# Patient Record
Sex: Male | Born: 1945
Health system: Southern US, Community
[De-identification: ages and names within clinical notes are randomized; demographics above are authoritative.]

## PROBLEM LIST (undated history)

## (undated) DIAGNOSIS — E785 Hyperlipidemia, unspecified: Secondary | ICD-10-CM

## (undated) DIAGNOSIS — K5792 Diverticulitis of intestine, part unspecified, without perforation or abscess without bleeding: Secondary | ICD-10-CM

## (undated) DIAGNOSIS — I10 Essential (primary) hypertension: Secondary | ICD-10-CM

## (undated) HISTORY — PX: KNEE ARTHROSCOPY AND ARTHROTOMY: SUR84

## (undated) HISTORY — PX: OTHER SURGICAL HISTORY: SHX169

## (undated) HISTORY — PX: CARPAL TUNNEL RELEASE: SHX101

## (undated) HISTORY — PX: TONSILLECTOMY: SUR1361

---

## 2011-12-27 ENCOUNTER — Emergency Department (INDEPENDENT_AMBULATORY_CARE_PROVIDER_SITE_OTHER): Payer: 59

## 2011-12-27 ENCOUNTER — Emergency Department (HOSPITAL_BASED_OUTPATIENT_CLINIC_OR_DEPARTMENT_OTHER)
Admission: EM | Admit: 2011-12-27 | Discharge: 2011-12-27 | Disposition: A | Discharge status: Home / Self Care | Payer: 59 | Attending: Emergency Medicine | Admitting: Emergency Medicine

## 2011-12-27 ENCOUNTER — Encounter (HOSPITAL_BASED_OUTPATIENT_CLINIC_OR_DEPARTMENT_OTHER): Payer: Self-pay | Admitting: *Deleted

## 2011-12-27 DIAGNOSIS — R112 Nausea with vomiting, unspecified: Secondary | ICD-10-CM | POA: Insufficient documentation

## 2011-12-27 DIAGNOSIS — E785 Hyperlipidemia, unspecified: Secondary | ICD-10-CM | POA: Insufficient documentation

## 2011-12-27 DIAGNOSIS — Z79899 Other long term (current) drug therapy: Secondary | ICD-10-CM | POA: Insufficient documentation

## 2011-12-27 DIAGNOSIS — K921 Melena: Secondary | ICD-10-CM

## 2011-12-27 DIAGNOSIS — R109 Unspecified abdominal pain: Secondary | ICD-10-CM | POA: Insufficient documentation

## 2011-12-27 DIAGNOSIS — A09 Infectious gastroenteritis and colitis, unspecified: Secondary | ICD-10-CM | POA: Insufficient documentation

## 2011-12-27 DIAGNOSIS — Z7982 Long term (current) use of aspirin: Secondary | ICD-10-CM | POA: Insufficient documentation

## 2011-12-27 DIAGNOSIS — N400 Enlarged prostate without lower urinary tract symptoms: Secondary | ICD-10-CM

## 2011-12-27 DIAGNOSIS — R197 Diarrhea, unspecified: Secondary | ICD-10-CM | POA: Insufficient documentation

## 2011-12-27 DIAGNOSIS — R933 Abnormal findings on diagnostic imaging of other parts of digestive tract: Secondary | ICD-10-CM

## 2011-12-27 DIAGNOSIS — I10 Essential (primary) hypertension: Secondary | ICD-10-CM | POA: Insufficient documentation

## 2011-12-27 HISTORY — DX: Essential (primary) hypertension: I10

## 2011-12-27 HISTORY — DX: Diverticulitis of intestine, part unspecified, without perforation or abscess without bleeding: K57.92

## 2011-12-27 HISTORY — DX: Hyperlipidemia, unspecified: E78.5

## 2011-12-27 LAB — COMPREHENSIVE METABOLIC PANEL
ALT: 24 U/L (ref 0–53)
AST: 26 U/L (ref 0–37)
Albumin: 4.5 g/dL (ref 3.5–5.2)
Alkaline Phosphatase: 55 U/L (ref 39–117)
BUN: 18 mg/dL (ref 6–23)
CO2: 26 mEq/L (ref 19–32)
Calcium: 9.8 mg/dL (ref 8.4–10.5)
Chloride: 100 mEq/L (ref 96–112)
Creatinine, Ser: 0.9 mg/dL (ref 0.50–1.35)
GFR calc Af Amer: >90 mL/min (ref >90)
GFR calc non Af Amer: 87 mL/min — ABNORMAL LOW (ref >90)
Glucose, Bld: 115 mg/dL — ABNORMAL HIGH (ref 70–99)
Potassium: 4.1 meq/L (ref 3.5–5.1)
Sodium: 137 meq/L (ref 135–145)
Total Bilirubin: 0.7 mg/dL (ref 0.3–1.2)
Total Protein: 8 g/dL (ref 6.0–8.3)

## 2011-12-27 LAB — CBC
HCT: 44.2 % (ref 39.0–52.0)
HCT: 41.7 % (ref 39.0–52.0)
Hemoglobin: 15.1 g/dL (ref 13.0–17.0)
Hemoglobin: 14.2 g/dL (ref 13.0–17.0)
MCH: 30.7 pg (ref 26.0–34.0)
MCH: 30.6 pg (ref 26.0–34.0)
MCHC: 34.2 g/dL (ref 30.0–36.0)
MCHC: 34.1 g/dL (ref 30.0–36.0)
MCV: 89.8 fL (ref 78.0–100.0)
MCV: 89.9 fL (ref 78.0–100.0)
Platelets: 181 10*3/uL (ref 150–400)
Platelets: 174 10*3/uL (ref 150–400)
RBC: 4.92 MIL/uL (ref 4.22–5.81)
RBC: 4.64 MIL/uL (ref 4.22–5.81)
RDW: 13.2 % (ref 11.5–15.5)
RDW: 13.2 % (ref 11.5–15.5)
WBC: 13.7 10*3/uL — ABNORMAL HIGH (ref 4.0–10.5)
WBC: 12.8 10*3/uL — ABNORMAL HIGH (ref 4.0–10.5)

## 2011-12-27 LAB — URINALYSIS, ROUTINE W REFLEX MICROSCOPIC
Glucose, UA: NEGATIVE mg/dL
Hgb urine dipstick: NEGATIVE
Ketones, ur: 15 mg/dL — AB
Leukocytes, UA: NEGATIVE
Nitrite: NEGATIVE
Protein, ur: NEGATIVE mg/dL
Specific Gravity, Urine: 1.021 (ref 1.005–1.030)
Urobilinogen, UA: 1 mg/dL (ref 0.0–1.0)
pH: 5 (ref 5.0–8.0)

## 2011-12-27 LAB — DIFFERENTIAL
Basophils Absolute: 0 10*3/uL (ref 0.0–0.1)
Basophils Relative: 0 % (ref 0–1)
Eosinophils Absolute: 0.1 10*3/uL (ref 0.0–0.7)
Eosinophils Relative: 0 % (ref 0–5)
Lymphocytes Relative: 6 % — ABNORMAL LOW (ref 12–46)
Lymphs Abs: 0.8 10*3/uL (ref 0.7–4.0)
Monocytes Absolute: 1 10*3/uL (ref 0.1–1.0)
Monocytes Relative: 7 % (ref 3–12)
Neutro Abs: 11.8 10*3/uL — ABNORMAL HIGH (ref 1.7–7.7)
Neutrophils Relative %: 86 % — ABNORMAL HIGH (ref 43–77)

## 2011-12-27 LAB — OCCULT BLOOD X 1 CARD TO LAB, STOOL: Fecal Occult Bld: POSITIVE

## 2011-12-27 LAB — LIPASE, BLOOD: Lipase: 46 U/L (ref 11–59)

## 2011-12-27 LAB — LACTIC ACID, PLASMA: Lactic Acid, Venous: 1.3 mmol/L (ref 0.5–2.2)

## 2011-12-27 MED ORDER — METRONIDAZOLE IN NACL 5-0.79 MG/ML-% IV SOLN
500.0000 mg | Freq: Once | INTRAVENOUS | Status: AC
  Administered 2011-12-27: 500 mg via INTRAVENOUS
  Filled 2011-12-27 (×2): qty 100

## 2011-12-27 MED ORDER — HYDROCODONE-ACETAMINOPHEN 5-325 MG PO TABS: 1.0000 | ORAL_TABLET | Freq: Four times a day (QID) | ORAL | Status: AC | PRN

## 2011-12-27 MED ORDER — ONDANSETRON 8 MG PO TBDP: 8.0000 mg | ORAL_TABLET | Freq: Three times a day (TID) | ORAL | Status: AC | PRN

## 2011-12-27 MED ORDER — HYDROMORPHONE HCL PF 1 MG/ML IJ SOLN
1.0000 mg | Freq: Once | INTRAMUSCULAR | Status: AC
  Administered 2011-12-27: 1 mg via INTRAVENOUS
  Filled 2011-12-27: qty 1

## 2011-12-27 MED ORDER — METRONIDAZOLE 500 MG PO TABS: 500.0000 mg | ORAL_TABLET | Freq: Two times a day (BID) | ORAL | Status: AC

## 2011-12-27 MED ORDER — HYDROMORPHONE HCL PF 1 MG/ML IJ SOLN
0.5000 mg | Freq: Once | INTRAMUSCULAR | Status: AC
  Administered 2011-12-27: 0.5 mg via INTRAVENOUS

## 2011-12-27 MED ORDER — HYDROMORPHONE HCL PF 1 MG/ML IJ SOLN
INTRAMUSCULAR | Status: AC
  Filled 2011-12-27: qty 1

## 2011-12-27 MED ORDER — ONDANSETRON HCL 4 MG/2ML IJ SOLN
4.0000 mg | Freq: Once | INTRAMUSCULAR | Status: AC
  Administered 2011-12-27: 4 mg via INTRAVENOUS
  Filled 2011-12-27: qty 2

## 2011-12-27 MED ORDER — CIPROFLOXACIN HCL 500 MG PO TABS: 500.0000 mg | ORAL_TABLET | Freq: Two times a day (BID) | ORAL | Status: AC

## 2011-12-27 MED ORDER — IOHEXOL 300 MG/ML  SOLN
100.0000 mL | Freq: Once | INTRAMUSCULAR | Status: AC | PRN
  Administered 2011-12-27: 100 mL via INTRAVENOUS

## 2011-12-27 MED ORDER — CIPROFLOXACIN IN D5W 400 MG/200ML IV SOLN
400.0000 mg | Freq: Once | INTRAVENOUS | Status: AC
  Administered 2011-12-27: 400 mg via INTRAVENOUS
  Filled 2011-12-27 (×2): qty 200

## 2011-12-27 MED ORDER — SODIUM CHLORIDE 0.9 % IV BOLUS (SEPSIS)
500.0000 mL | Freq: Once | INTRAVENOUS | Status: AC
  Administered 2011-12-27: 1000 mL via INTRAVENOUS

## 2011-12-27 MED ORDER — IOHEXOL 300 MG/ML  SOLN: 18.0000 mL | INTRAMUSCULAR | Status: DC | PRN

## 2011-12-27 NOTE — Discharge Instructions (Signed)
Colitis Colitis is inflammation of the colon. Colitis can be a short-term or long-standing (chronic) illness. Crohn's disease and ulcerative colitis are 2 types of colitis which are chronic. They usually require lifelong treatment. CAUSES  There are many different causes of colitis, including:  Viruses.   Germs (bacteria).   Medicine reactions.  SYMPTOMS   Diarrhea.   Intestinal bleeding.   Pain.   Fever.   Throwing up (vomiting).   Tiredness (fatigue).   Weight loss.   Bowel blockage.  DIAGNOSIS  The diagnosis of colitis is based on examination and stool or blood tests. X-rays, CT scan, and colonoscopy may also be needed. TREATMENT  Treatment may include:  Fluids given through the vein (intravenously).   Bowel rest (nothing to eat or drink for a period of time).   Medicine for pain and diarrhea.   Medicines (antibiotics) that kill germs.   Cortisone medicines.   Surgery.  HOME CARE INSTRUCTIONS   Get plenty of rest.   Drink enough water and fluids to keep your urine clear or pale yellow.   Eat a well-balanced diet.   Call your caregiver for follow-up as recommended.  SEEK IMMEDIATE MEDICAL CARE IF:   You develop chills.   You have an oral temperature above 102 F (38.9 C), not controlled by medicine.   You have extreme weakness, fainting, or dehydration.   You have repeated vomiting.   You develop severe belly (abdominal) pain or are passing bloody or tarry stools.  MAKE SURE YOU:   Understand these instructions.   Will watch your condition.   Will get help right away if you are not doing well or get worse.  Document Released: 10/19/2004 Document Revised: 08/31/2011 Document Reviewed: 01/14/2010 [employer] Patient Information 2012 [employer]. 

## 2011-12-27 NOTE — ED Provider Notes (Signed)
Pt seen with PA He is here for abd pain and rectal bleeding with h/o diverticulosis diagnosed by previous colonoscopy abd with focal tenderness Will get CT imaging  Joya Gaskins, MD 12/27/11 1945

## 2011-12-27 NOTE — ED Provider Notes (Signed)
History     CSN: [id number]  Arrival date & time 12/27/11  1478   First MD Initiated Contact with Patient 12/27/11 1848     7:15 PM HPI Patient reports today developed acute onset lower abdominal pain. States pain has progressively worsened. States symptoms were associated with nausea vomiting diarrhea. Reports now is having persistent bloody stool. Denies hematemesis, fever, urinary symptoms, back pain. Reports symptoms are similar prior diverticulitis episode. States that the symptoms however do feel worse. Denies fever, history of abdominal surgeries.  Patient is a 66 y.o. male presenting with abdominal pain. The history is provided by the patient.  Abdominal Pain The primary symptoms of the illness include abdominal pain, nausea, vomiting, diarrhea and hematochezia. The primary symptoms of the illness do not include fever, shortness of breath or dysuria. The current episode started 6 to 12 hours ago. The onset of the illness was gradual. The problem has been gradually worsening.  The pain came on gradually. The abdominal pain is located in the RLQ, LLQ and suprapubic region. The severity of the abdominal pain is 8/10. The abdominal pain is relieved by nothing. The abdominal pain is exacerbated by eating.  The diarrhea began today. The diarrhea is bloody.  Symptoms associated with the illness do not include chills, constipation, urgency, hematuria, frequency or back pain. Significant associated medical issues include diverticulitis.    Past Medical History  Diagnosis Date  . Diverticulitis   . Hypertension   . Hyperlipidemia     Past Surgical History  Procedure Date  . Tonsillectomy   . Joint replacement     History reviewed. No pertinent family history.  History  Substance Use Topics  . Smoking status: Never Smoker   . Smokeless tobacco: Not on file  . Alcohol Use: 2.4 oz/week    4 Cans of beer per week      Review of Systems  Constitutional: Negative for fever and  chills.  Respiratory: Negative for shortness of breath.   Cardiovascular: Negative for chest pain.  Gastrointestinal: Positive for nausea, vomiting, abdominal pain, diarrhea, blood in stool and hematochezia. Negative for constipation and rectal pain.  Genitourinary: Negative for dysuria, urgency, frequency, hematuria, flank pain, discharge, penile pain and testicular pain.  Musculoskeletal: Negative for back pain.  Neurological: Negative for dizziness, weakness, numbness and headaches.  All other systems reviewed and are negative.    Allergies  Review of patient's allergies indicates no known allergies.  Home Medications   Current Outpatient Rx  Name Route Sig Dispense Refill  . ASPIRIN 81 MG PO TABS Oral Take 81 mg by mouth daily.    Marland Kitchen PRILOSEC PO Oral Take 1 capsule by mouth daily.    Marland Kitchen SIMVASTATIN 40 MG PO TABS Oral Take 40 mg by mouth every evening.      Pulse 67  Temp(Src) 98 F (36.7 C) (Oral)  Resp 16  Ht 5\' 7"  (1.702 m)  Wt 190 lb (86.183 kg)  BMI 29.76 kg/m2  SpO2 100%  Physical Exam  Constitutional: He is oriented to person, place, and time. He appears well-developed and well-nourished.  HENT:  Head: Normocephalic and atraumatic.  Eyes: Conjunctivae are normal. Pupils are equal, round, and reactive to light.  Neck: Normal range of motion. Neck supple.  Cardiovascular: Normal rate, regular rhythm and normal heart sounds.  Exam reveals no gallop and no friction rub.   No murmur heard. Pulmonary/Chest: Effort normal and breath sounds normal. No respiratory distress. He has no wheezes. He has no rales.  He exhibits no tenderness.  Abdominal: Soft. Bowel sounds are normal. He exhibits no distension and no mass. There is no tenderness. There is no rebound and no guarding.  Neurological: He is alert and oriented to person, place, and time.  Skin: Skin is warm and dry. No rash noted. No erythema. No pallor.  Psychiatric: He has a normal mood and affect. His behavior is  normal.    ED Course  Procedures  Results for orders placed during the hospital encounter of 12/27/11  CBC      Component Value Range   WBC 13.7 (*) 4.0 - 10.5 (K/uL)   RBC 4.92  4.22 - 5.81 (MIL/uL)   Hemoglobin 15.1  13.0 - 17.0 (g/dL)   HCT 96.0  45.4 - 09.8 (%)   MCV 89.8  78.0 - 100.0 (fL)   MCH 30.7  26.0 - 34.0 (pg)   MCHC 34.2  30.0 - 36.0 (g/dL)   RDW 11.9  14.7 - 82.9 (%)   Platelets 181  150 - 400 (K/uL)  DIFFERENTIAL      Component Value Range   Neutrophils Relative 86 (*) 43 - 77 (%)   Neutro Abs 11.8 (*) 1.7 - 7.7 (K/uL)   Lymphocytes Relative 6 (*) 12 - 46 (%)   Lymphs Abs 0.8  0.7 - 4.0 (K/uL)   Monocytes Relative 7  3 - 12 (%)   Monocytes Absolute 1.0  0.1 - 1.0 (K/uL)   Eosinophils Relative 0  0 - 5 (%)   Eosinophils Absolute 0.1  0.0 - 0.7 (K/uL)   Basophils Relative 0  0 - 1 (%)   Basophils Absolute 0.0  0.0 - 0.1 (K/uL)  COMPREHENSIVE METABOLIC PANEL      Component Value Range   Sodium 137  135 - 145 (mEq/L)   Potassium 4.1  3.5 - 5.1 (mEq/L)   Chloride 100  96 - 112 (mEq/L)   CO2 26  19 - 32 (mEq/L)   Glucose, Bld 115 (*) 70 - 99 (mg/dL)   BUN 18  6 - 23 (mg/dL)   Creatinine, Ser 5.62  0.50 - 1.35 (mg/dL)   Calcium 9.8  8.4 - 13.0 (mg/dL)   Total Protein 8.0  6.0 - 8.3 (g/dL)   Albumin 4.5  3.5 - 5.2 (g/dL)   AST 26  0 - 37 (U/L)   ALT 24  0 - 53 (U/L)   Alkaline Phosphatase 55  39 - 117 (U/L)   Total Bilirubin 0.7  0.3 - 1.2 (mg/dL)   GFR calc non Af Amer 87 (*) >90 (mL/min)   GFR calc Af Amer >90  >90 (mL/min)  LIPASE, BLOOD      Component Value Range   Lipase 46  11 - 59 (U/L)  URINALYSIS, ROUTINE W REFLEX MICROSCOPIC      Component Value Range   Color, Urine AMBER (*) YELLOW    APPearance CLEAR  CLEAR    Specific Gravity, Urine 1.021  1.005 - 1.030    pH 5.0  5.0 - 8.0    Glucose, UA NEGATIVE  NEGATIVE (mg/dL)   Hgb urine dipstick NEGATIVE  NEGATIVE    Bilirubin Urine SMALL (*) NEGATIVE    Ketones, ur 15 (*) NEGATIVE (mg/dL)    Protein, ur NEGATIVE  NEGATIVE (mg/dL)   Urobilinogen, UA 1.0  0.0 - 1.0 (mg/dL)   Nitrite NEGATIVE  NEGATIVE    Leukocytes, UA NEGATIVE  NEGATIVE   OCCULT BLOOD X 1 CARD TO LAB, STOOL  Component Value Range   Fecal Occult Bld POSITIVE    CBC      Component Value Range   WBC 12.8 (*) 4.0 - 10.5 (K/uL)   RBC 4.64  4.22 - 5.81 (MIL/uL)   Hemoglobin 14.2  13.0 - 17.0 (g/dL)   HCT 81.1  91.4 - 78.2 (%)   MCV 89.9  78.0 - 100.0 (fL)   MCH 30.6  26.0 - 34.0 (pg)   MCHC 34.1  30.0 - 36.0 (g/dL)   RDW 95.6  21.3 - 08.6 (%)   Platelets 174  150 - 400 (K/uL)  LACTIC ACID, PLASMA      Component Value Range   Lactic Acid, Venous 1.3  0.5 - 2.2 (mmol/L)   Ct Abdomen Pelvis W Contrast  12/27/2011  *RADIOLOGY REPORT*  Clinical Data: Rule out diverticulitis, abdominal pain, bloody diarrhea  CT ABDOMEN AND PELVIS WITH CONTRAST  Technique:  Multidetector CT imaging of the abdomen and pelvis was performed following the standard protocol during bolus administration of intravenous contrast.  Contrast:  100 ml of Omnipaque-300  Comparison: None.  Findings: Lung bases are unremarkable.  Sagittal images of the spine shows mild degenerative changes lumbar spine. Atherosclerotic calcifications are noted abdominal aorta and the iliac arteries.  No aortic aneurysm.  Small amount of oral contrast is noted in distal esophagus probable from gastroesophageal reflux.  Enhanced liver, pancreas, spleen and adrenals are unremarkable.  Enhanced kidneys are symmetrical in size.  No hydronephrosis or hydroureter.  Delayed renal images shows bilateral renal symmetrical excretion. Bilateral visualized proximal ureter is unremarkable.  There is mild thickening of the left colonic wall.  This is best visualized in axial image 42.  Mild stranding of the left pericolonic fat.  These are best visualized in coronal image 40. Findings are highly suspicious for segmental colitis. This  may be infectious, or ischemic in nature.  Less  likely neoplastic or inflammatory bowel disease.  Clinical correlation is necessary.  Minimal thickening of the wall in  proximal sigmoid colon.  The distal sigmoid colon and the rectum are unremarkable.  No destructive bony lesions are noted within pelvis.  No pericecal inflammation.  Normal appendix is clearly visualized in axial image 50.  Mild enlarged prostate gland measures 5.3 x 5.2 cm with indentation of urinary bladder base.  Nonspecific mild thickening of urinary bladder wall.  IMPRESSION:  1. There is abnormal appearance of the left colon with thickening of the wall and mild stranding surrounding fat.  The findings are highly suspicious for segmental colitis which may be infectious or ischemic in nature.  Less likely neoplastic or inflammatory bowel disease.  2.  There is no evidence of diverticulitis. 3.  Mild enlarged prostate gland with indentation of urinary bladder base.  Nonspecific mild thickening of urinary bladder wall. 4.  No pericecal inflammation.  Normal appendix. 5.  No hydronephrosis or hydroureter.  Original Report Authenticated By: Natasha Mead, M.D.     MDM   Pt will receive IV cipro and flagyl. Will d/c with prescriptions of the same. Reports significant improvement of pain. Discussed plan with Dr. Bebe Shaggy and signed out patient to him         Thomasene Lot, PA-C 12/28/11 2356

## 2011-12-27 NOTE — ED Notes (Signed)
Pt c/o lower abd pain with n/v/d and hx diverticulitis

## 2011-12-28 NOTE — ED Provider Notes (Signed)
Call received from a pharmacy. Patient is also taking fluoxetine which was not listed on his medication list on the ED chart. There is an interaction between fluoxetine and ciprofloxacin. Prescription for ciprofloxacin was changed to Augmentin 875 mg #20 to take 1 twice a day.  Dione Booze, MD 12/28/11 [phone]

## 2011-12-30 NOTE — ED Provider Notes (Signed)
Medical screening examination/treatment/procedure(s) were conducted as a shared visit with non-physician practitioner(s) and myself.  I personally evaluated the patient during the encounter  Pt checked on frequently, pt sleeping, improved, vitals appropriate BP 139/68  Pulse 64  Temp(Src) 97.8 F (36.6 C) (Oral)  Resp 16  Ht 5\' 7"  (1.702 m)  Wt 190 lb (86.183 kg)  BMI 29.76 kg/m2  SpO2 96% I doubt ischemic colitis.  I doubt serious GI bleed  I feel he is safe for d/c and antibiotics as outpatient  Joya Gaskins, MD 12/30/11 [phone]

## 2012-01-01 ENCOUNTER — Encounter: Payer: Self-pay | Admitting: Gastroenterology

## 2012-01-31 ENCOUNTER — Ambulatory Visit (INDEPENDENT_AMBULATORY_CARE_PROVIDER_SITE_OTHER): Payer: 59 | Admitting: Gastroenterology

## 2012-01-31 ENCOUNTER — Encounter: Payer: Self-pay | Admitting: Gastroenterology

## 2012-01-31 VITALS — BP 112/62 | HR 77 | Ht 67.0 in | Wt 202.6 lb

## 2012-01-31 DIAGNOSIS — K559 Vascular disorder of intestine, unspecified: Secondary | ICD-10-CM | POA: Insufficient documentation

## 2012-01-31 MED ORDER — PEG-KCL-NACL-NASULF-NA ASC-C 100 G PO SOLR: 1.0000 | Freq: Once | ORAL | Status: DC

## 2012-01-31 NOTE — Assessment & Plan Note (Signed)
The patient has had 2 limited episodes of colitis with most recent scans consistent with ischemic colitis. He has some residual discomfort in his lower abdomen which at least raises the question whether he has an underlying colitis. I think this is less likely.  Recommendations #1 colonoscopy #2 the patient was carefully instructed to maintain his hydration at all times #3 to consider CT angiogram

## 2012-01-31 NOTE — Patient Instructions (Signed)
Your Colonoscopy is scheduled on 02/08/2012 at 10:30am  Separate instructions have been given We are obtaining your records from [facility]  Colonoscopy A colonoscopy is an exam to evaluate your entire colon. In this exam, your colon is cleansed. A long fiberoptic tube is inserted through your rectum and into your colon. The fiberoptic scope (endoscope) is a long bundle of enclosed and very flexible fibers. These fibers transmit light to the area examined and send images from that area to your caregiver. Discomfort is usually minimal. You may be given a drug to help you sleep (sedative) during or prior to the procedure. This exam helps to detect lumps (tumors), polyps, inflammation, and areas of bleeding. Your caregiver may also take a small piece of tissue (biopsy) that will be examined under a microscope. LET YOUR CAREGIVER KNOW ABOUT:   Allergies to food or medicine.   Medicines taken, including vitamins, herbs, eyedrops, over-the-counter medicines, and creams.   Use of steroids (by mouth or creams).   Previous problems with anesthetics or numbing medicines.   History of bleeding problems or blood clots.   Previous surgery.   Other health problems, including diabetes and kidney problems.   Possibility of pregnancy, if this applies.  BEFORE THE PROCEDURE   A clear liquid diet may be required for 2 days before the exam.   Ask your caregiver about changing or stopping your regular medications.   Liquid injections (enemas) or laxatives may be required.   A large amount of electrolyte solution may be given to you to drink over a short period of time. This solution is used to clean out your colon.   You should be present 60 minutes prior to your procedure or as directed by your caregiver.  AFTER THE PROCEDURE   If you received a sedative or pain relieving medication, you will need to arrange for someone to drive you home.   Occasionally, there is a little blood passed with the  first bowel movement. Do not be concerned.  FINDING OUT THE RESULTS OF YOUR TEST Not all test results are available during your visit. If your test results are not back during the visit, make an appointment with your caregiver to find out the results. Do not assume everything is normal if you have not heard from your caregiver or the medical facility. It is important for you to follow up on all of your test results. HOME CARE INSTRUCTIONS   It is not unusual to pass moderate amounts of gas and experience mild abdominal cramping following the procedure. This is due to air being used to inflate your colon during the exam. Walking or a warm pack on your belly (abdomen) may help.   You may resume all normal meals and activities after sedatives and medicines have worn off.   Only take over-the-counter or prescription medicines for pain, discomfort, or fever as directed by your caregiver. Do not use aspirin or blood thinners if a biopsy was taken. Consult your caregiver for medicine usage if biopsies were taken.  SEEK IMMEDIATE MEDICAL CARE IF:   You have a fever.   You pass large blood clots or fill a toilet with blood following the procedure. This may also occur 10 to 14 days following the procedure. This is more likely if a biopsy was taken.   You develop abdominal pain that keeps getting worse and cannot be relieved with medicine.  Document Released: 09/08/2000 Document Revised: 08/31/2011 Document Reviewed: 04/23/2008 [facility] Patient Information 2012 [employer]. 

## 2012-01-31 NOTE — Progress Notes (Signed)
History of Present Illness: Steven Hardin is a pleasant 66 year old white male referred at the request of Dr. Lawerance Bach for evaluation of abdominal pain and diarrhea.  Approximately a month ago he was seen in the ER because of lower bowel pain and bloody diarrhea. CT scan, which I reviewed, demonstrated a segmental colitis involving the left colon suggestive of infectious or ischemic colitis. Symptoms have subsided although he does still has some disquietude in his lower abdomen. Bowel movements are solid. He may see occasional blood on the toilet tissue. Approximately 2 years ago he had a similar episode. He was told that he had colitis as a result of dehydration. He believes that he underwent colonoscopy. He otherwise has been well. It is noteworthy that calcifications were seen in his aorta and iliac vessels on CT scan.    Past Medical History  Diagnosis Date  . Diverticulitis   . Hypertension   . Hyperlipidemia    Past Surgical History  Procedure Date  . Tonsillectomy   . Bone spur     in both shoulders   family history includes Cancer in his father and Leukemia in his sister. Current Outpatient Prescriptions  Medication Sig Dispense Refill  . aspirin 81 MG tablet Take 81 mg by mouth daily.      . metoprolol succinate (TOPROL-XL) 100 MG 24 hr tablet Take 100 mg by mouth daily. Take with or immediately following a meal.      . Omeprazole (PRILOSEC PO) Take 1 capsule by mouth daily.      . simvastatin (ZOCOR) 40 MG tablet Take 40 mg by mouth every evening.       Allergies as of 01/31/2012  . (No Known Allergies)    reports that he quit smoking about 27 years ago. He has never used smokeless tobacco. He reports that he drinks about 2.4 ounces of alcohol per week. He reports that he does not use illicit drugs.     Review of Systems:  He complains of arthritis in his hands Pertinent positive and negative review of systems were noted in the above HPI section. All other review of systems were  otherwise negative.  Vital signs were reviewed in today's medical record Physical Exam: General: Well developed , well nourished, no acute distress Head: Normocephalic and atraumatic Eyes:  sclerae anicteric, EOMI Ears: Normal auditory acuity Mouth: No deformity or lesions Neck: Supple, no masses or thyromegaly Lungs: Clear throughout to auscultation Heart: Regular rate and rhythm; no murmurs, rubs or bruits Abdomen: Soft, non tender and non distended. No masses, hepatosplenomegaly or hernias noted. Normal Bowel sounds Rectal:deferred Musculoskeletal: Symmetrical with no gross deformities  Skin: No lesions on visible extremities Pulses:  Normal pulses noted Extremities: No clubbing, cyanosis, edema or deformities noted Neurological: Alert oriented x 4, grossly nonfocal Cervical Nodes:  No significant cervical adenopathy Inguinal Nodes: No significant inguinal adenopathy Psychological:  Alert and cooperative. Normal mood and affect

## 2012-02-08 ENCOUNTER — Encounter: Payer: Self-pay | Admitting: Gastroenterology

## 2012-02-08 ENCOUNTER — Ambulatory Visit (AMBULATORY_SURGERY_CENTER): Payer: 59 | Admitting: Gastroenterology

## 2012-02-08 VITALS — BP 160/76 | HR 57 | Temp 97.8°F | Resp 20 | Ht 67.0 in | Wt 202.0 lb

## 2012-02-08 DIAGNOSIS — K559 Vascular disorder of intestine, unspecified: Secondary | ICD-10-CM

## 2012-02-08 DIAGNOSIS — K5730 Diverticulosis of large intestine without perforation or abscess without bleeding: Secondary | ICD-10-CM

## 2012-02-08 MED ORDER — SODIUM CHLORIDE 0.9 % IV SOLN: 500.0000 mL | INTRAVENOUS | Status: DC

## 2012-02-08 NOTE — Progress Notes (Signed)
Patient did not experience any of the following events: a burn prior to discharge; a fall within the facility; wrong site/side/patient/procedure/implant event; or a hospital transfer or hospital admission upon discharge from the facility. (G8907) Patient did not have preoperative order for IV antibiotic SSI prophylaxis. (G8918)  

## 2012-02-08 NOTE — Op Note (Signed)
[facility] [address] [place], [place]  [address]  COLONOSCOPY PROCEDURE REPORT  PATIENT:  Steven, Hardin  MR#:  [id number] BIRTHDATE:  1946-01-08, 65 yrs. old  GENDER:  male ENDOSCOPIST:  Orlan Aversa. Arlyce Dice, MD REF. BY: PROCEDURE DATE:  02/08/2012 PROCEDURE:  Diagnostic Colonoscopy ASA CLASS:  Class II INDICATIONS:  Abnormal CT of abdomen Segmental colitis MEDICATIONS:   MAC sedation, administered by CRNA propofol 200mg IV  DESCRIPTION OF PROCEDURE:   After the risks benefits and alternatives of the procedure were thoroughly explained, informed consent was obtained.  Digital rectal exam was performed and revealed no abnormalities.   The LB [id number] endoscope was introduced through the anus and advanced to the cecum, which was identified by both the appendix and ileocecal valve, without limitations.  The quality of the prep was excellent, using MoviPrep.  The instrument was then slowly withdrawn as the colon was fully examined. <<PROCEDUREIMAGES>>  FINDINGS:  Moderate diverticulosis was found in the sigmoid colon (see image2).  This was otherwise a normal examination of the colon (see image1 and image3).   Retroflexed views in the rectum revealed no abnormalities.    The time to cecum =  1) 5.0 minutes. The scope was then withdrawn in  1) 7.25  minutes from the cecum and the procedure completed. COMPLICATIONS:  None ENDOSCOPIC IMPRESSION: 1) Moderate diverticulosis in the sigmoid colon 2) Otherwise normal examination RECOMMENDATIONS: 1) GI followup as needed REPEAT EXAM:  In 10 year(s) for Colonoscopy.  ______________________________ Barbette Hair. Arlyce Dice, MD  CC:  Gildardo Griffes, MD  n. Rosalie Doctor:   Barbette Hair. Lidwina Kaner at 02/08/2012 11:18 AM  [place], [phone]

## 2012-02-08 NOTE — Progress Notes (Signed)
PROPOFOL PER J NULTY CRNA. SEE SCANNED INTRA PROCEDURE REPORT. EWM 

## 2012-02-08 NOTE — Patient Instructions (Signed)
YOU HAD AN ENDOSCOPIC PROCEDURE TODAY AT THE [facility]: Refer to the procedure report that was given to you for any specific questions about what was found during the examination.  If the procedure report does not answer your questions, please call your gastroenterologist to clarify.  If you requested that your care partner not be given the details of your procedure findings, then the procedure report has been included in a sealed envelope for you to review at your convenience later.  YOU SHOULD EXPECT: Some feelings of bloating in the abdomen. Passage of more gas than usual.  Walking can help get rid of the air that was put into your GI tract during the procedure and reduce the bloating. If you had a lower endoscopy (such as a colonoscopy or flexible sigmoidoscopy) you may notice spotting of blood in your stool or on the toilet paper. If you underwent a bowel prep for your procedure, then you may not have a normal bowel movement for a few days.  DIET: Your first meal following the procedure should be a light meal and then it is ok to progress to your normal diet.  A half-sandwich or bowl of soup is an example of a good first meal.  Heavy or fried foods are harder to digest and may make you feel nauseous or bloated.  Likewise meals heavy in dairy and vegetables can cause extra gas to form and this can also increase the bloating.  Drink plenty of fluids but you should avoid alcoholic beverages for 24 hours.  ACTIVITY: Your care partner should take you home directly after the procedure.  You should plan to take it easy, moving slowly for the rest of the day.  You can resume normal activity the day after the procedure however you should NOT DRIVE or use heavy machinery for 24 hours (because of the sedation medicines used during the test).    SYMPTOMS TO REPORT IMMEDIATELY: A gastroenterologist can be reached at any hour.  During normal business hours, 8:30 AM to 5:00 PM Monday through Friday,  call [phone].  After hours and on weekends, please call the GI answering service at [phone] who will take a message and have the physician on call contact you.   Following lower endoscopy (colonoscopy or flexible sigmoidoscopy):  Excessive amounts of blood in the stool  Significant tenderness or worsening of abdominal pains  Swelling of the abdomen that is new, acute  Fever of 100F or higher    FOLLOW UP: If any biopsies were taken you will be contacted by phone or by letter within the next 1-3 weeks.  Call your gastroenterologist if you have not heard about the biopsies in 3 weeks.  Our staff will call the home number listed on your records the next business day following your procedure to check on you and address any questions or concerns that you may have at that time regarding the information given to you following your procedure. This is a courtesy call and so if there is no answer at the home number and we have not heard from you through the emergency physician on call, we will assume that you have returned to your regular daily activities without incident.  SIGNATURES/CONFIDENTIALITY: You and/or your care partner have signed paperwork which will be entered into your electronic medical record.  These signatures attest to the fact that that the information above on your After Visit Summary has been reviewed and is understood.  Full responsibility of the confidentiality   of this discharge information lies with you and/or your care-partner.     

## 2012-02-09 ENCOUNTER — Telehealth: Payer: Self-pay | Admitting: *Deleted

## 2012-02-09 NOTE — Telephone Encounter (Signed)
  Follow up Call-  Call back number 02/08/2012  Post procedure Call Back phone  # [phone]  Permission to leave phone message Yes     Patient questions:  Do you have a fever, pain , or abdominal swelling? no Pain Score  0 *  Have you tolerated food without any problems? yes  Have you been able to return to your normal activities? yes  Do you have any questions about your discharge instructions: Diet   no Medications  no Follow up visit  no  Do you have questions or concerns about your Care? no  Actions: * If pain score is 4 or above: No action needed, pain <4.

## 2012-08-26 IMAGING — CT CT ABD-PELV W/ CM
2 of 5 series · 15 of 46 positions shown, 17 images · IV contrast (APPLIED)
Comparison: None.

CLINICAL DATA: Rule out diverticulitis, abdominal pain, bloody
diarrhea

CT ABDOMEN AND PELVIS WITH CONTRAST
TECHNIQUE: Multidetector CT imaging of the abdomen and pelvis was
performed following the standard protocol during bolus
administration of intravenous contrast.
Contrast:  100 ml of Rmnipaque-ODD

[Series 2: abd/pelvis 5.0 b31f · axial · 0.74mm/px · z∈[+853,+1258]mm · 12 of 91 slices shown, 14 images]
[im 5/91  soft-tissue]
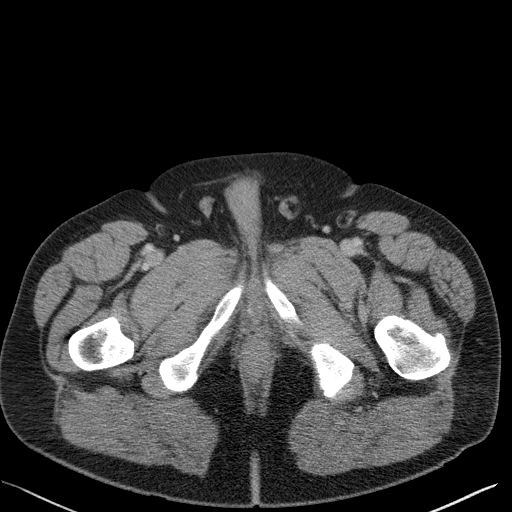
[im 5/91  bone]
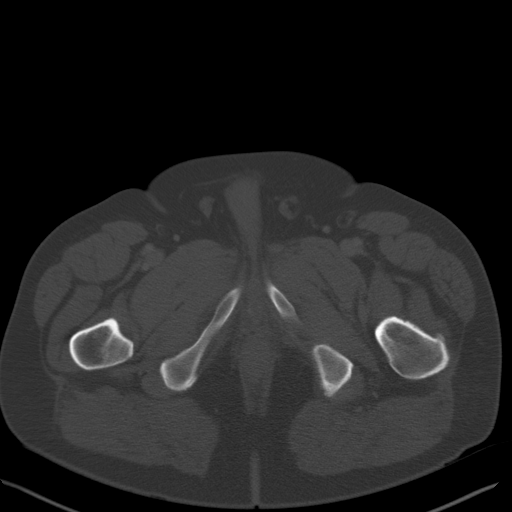
[im 14/91  soft-tissue]
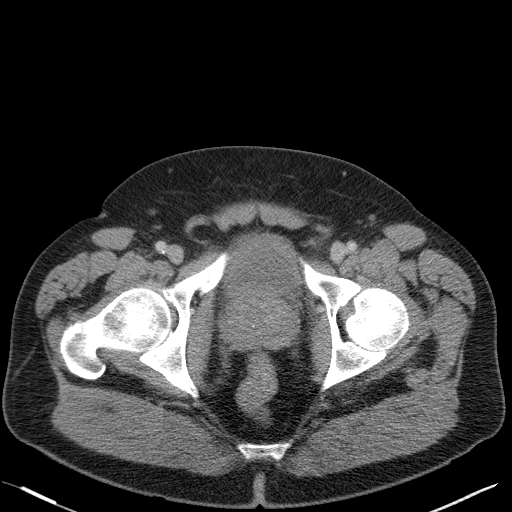
[im 19/91  soft-tissue]
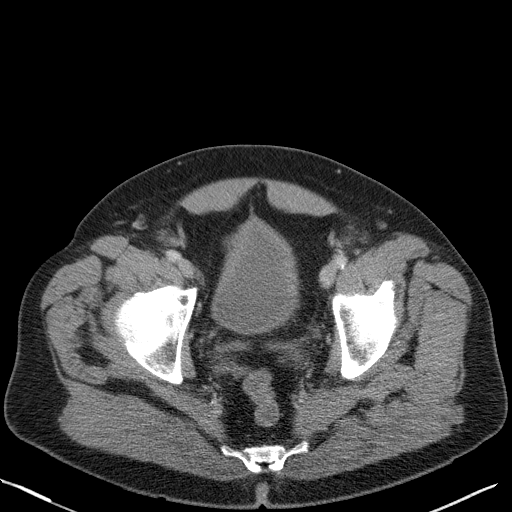
[im 28/91  soft-tissue]
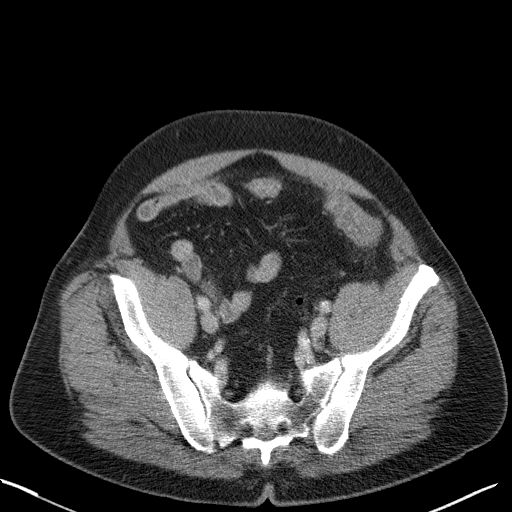
[im 37/91  soft-tissue]
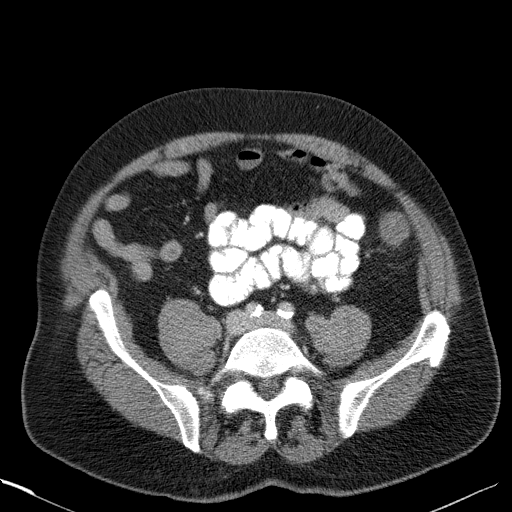
[im 41/91  soft-tissue]
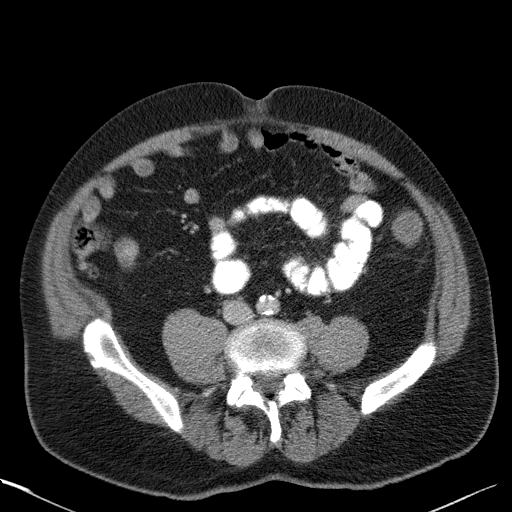
[im 50/91  soft-tissue]
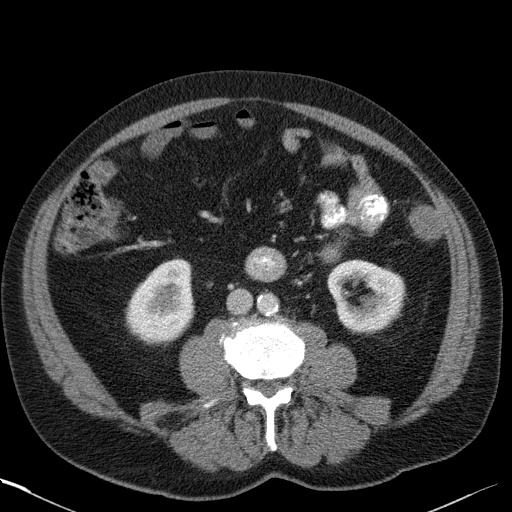
[im 55/91  soft-tissue]
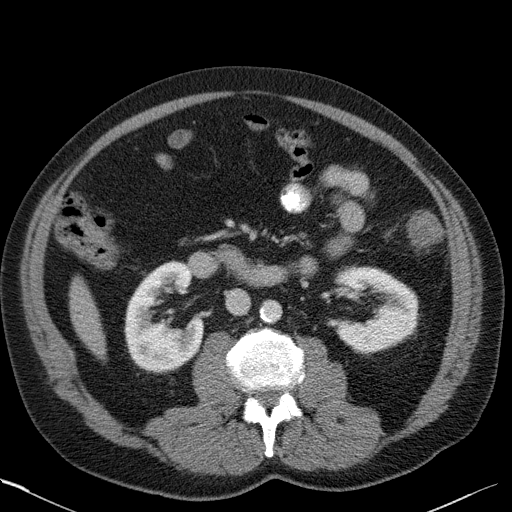
[im 64/91  soft-tissue]
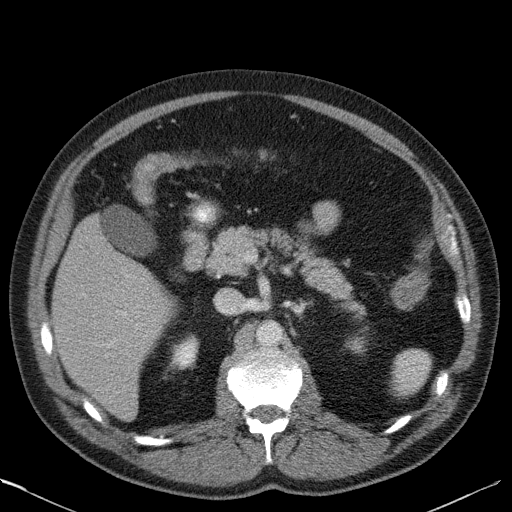
[im 64/91  bone]
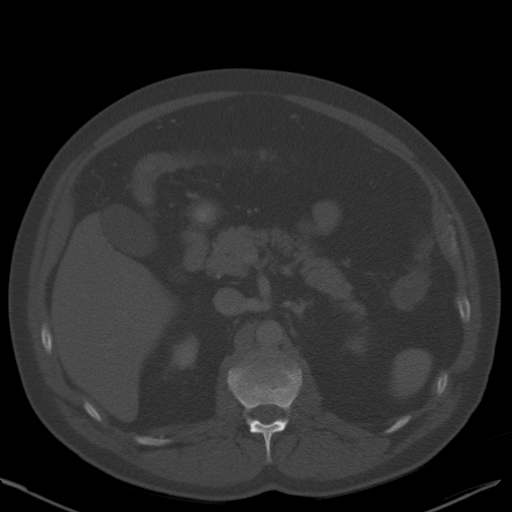
[im 73/91  soft-tissue]
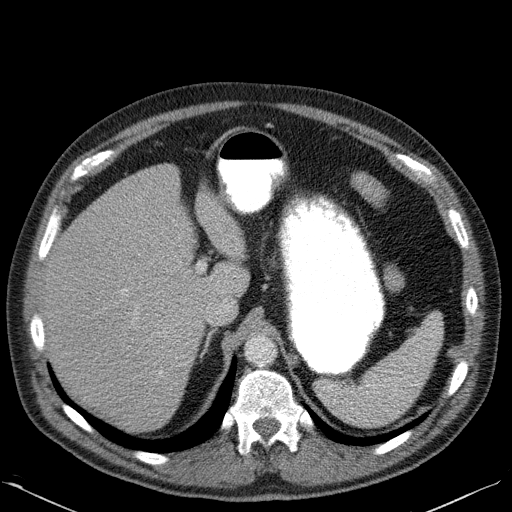
[im 77/91  soft-tissue]
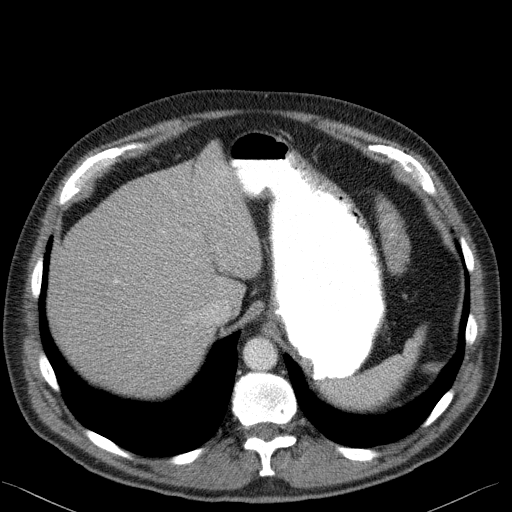
[im 86/91  soft-tissue]
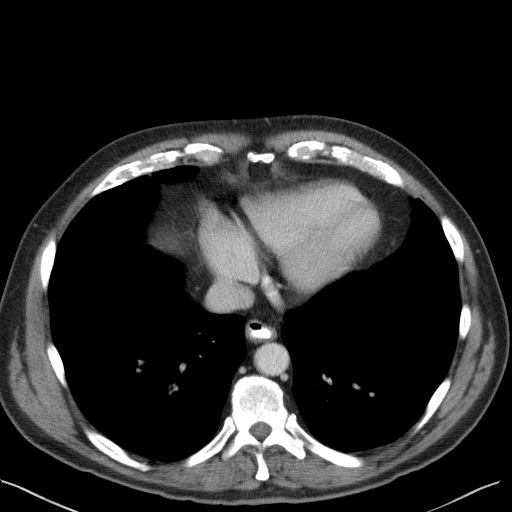

[Series 5: abd/pelvis 3.0 coronal · coronal · 0.68mm/px · 3 of 110 slices shown]
[im 37/110  soft-tissue]
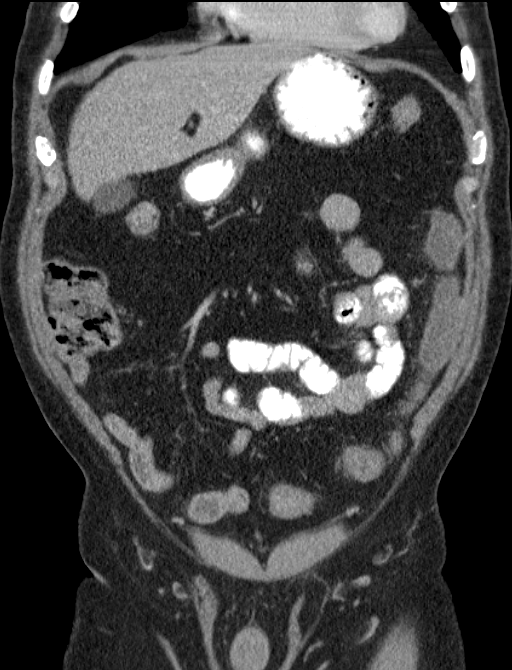
[im 49/110  soft-tissue]
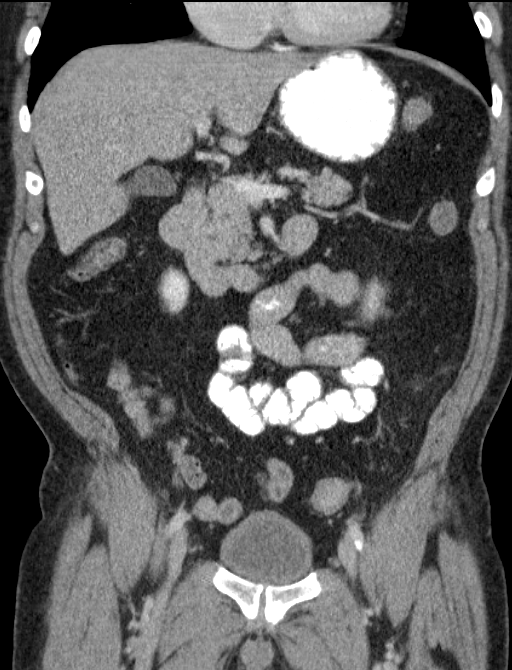
[im 61/110  soft-tissue]
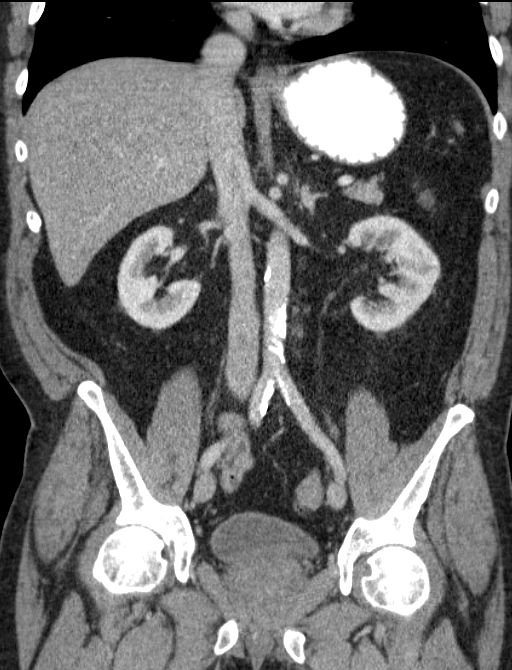

[15 of 46 positions shown; findings below may reference images not displayed]

FINDINGS: Lung bases are unremarkable.  Sagittal images of the
spine shows mild degenerative changes lumbar spine.
Atherosclerotic calcifications are noted abdominal aorta and the
iliac arteries.  No aortic aneurysm.  Small amount of oral contrast
is noted in distal esophagus probable from gastroesophageal reflux.

Enhanced liver, pancreas, spleen and adrenals are unremarkable.

Enhanced kidneys are symmetrical in size.  No hydronephrosis or
hydroureter.

Delayed renal images shows bilateral renal symmetrical excretion.
Bilateral visualized proximal ureter is unremarkable.

There is mild thickening of the left colonic wall.  This is best
visualized in axial image 42.  Mild stranding of the left
pericolonic fat.  These are best visualized in coronal image 40.
Findings are highly suspicious for segmental colitis. This  may be
infectious, or ischemic in nature.  Less likely neoplastic or
inflammatory bowel disease..

Clinical correlation is necessary.  Minimal thickening of the wall
in  proximal sigmoid colon.  The distal sigmoid colon and the
rectum are unremarkable.  No destructive bony lesions are noted
within pelvis.

No pericecal inflammation.  Normal appendix is clearly visualized
in axial image 50.

Mild enlarged prostate gland measures 5.3 x 5.2 cm with indentation
of urinary bladder base.  Nonspecific mild thickening of urinary
bladder wall.
IMPRESSION: 1. There is abnormal appearance of the left colon with thickening
of the wall and mild stranding surrounding fat.  The findings are
highly suspicious for segmental colitis which may be infectious or
ischemic in nature.  Less likely neoplastic or inflammatory bowel
disease..

2.  There is no evidence of diverticulitis.
3.  Mild enlarged prostate gland with indentation of urinary
bladder base.  Nonspecific mild thickening of urinary bladder wall.
4.  No pericecal inflammation.  Normal appendix.
5.  No hydronephrosis or hydroureter.

## 2013-09-03 ENCOUNTER — Ambulatory Visit (HOSPITAL_COMMUNITY): Payer: 59

## 2015-11-03 DIAGNOSIS — I10 Essential (primary) hypertension: Secondary | ICD-10-CM | POA: Insufficient documentation

## 2015-11-03 DIAGNOSIS — E785 Hyperlipidemia, unspecified: Secondary | ICD-10-CM | POA: Insufficient documentation

## 2017-05-06 ENCOUNTER — Emergency Department (HOSPITAL_BASED_OUTPATIENT_CLINIC_OR_DEPARTMENT_OTHER)
Admission: EM | Admit: 2017-05-06 | Discharge: 2017-05-06 | Disposition: A | Discharge status: Home / Self Care | Payer: Medicare Other | Attending: Emergency Medicine | Admitting: Emergency Medicine

## 2017-05-06 ENCOUNTER — Emergency Department (HOSPITAL_BASED_OUTPATIENT_CLINIC_OR_DEPARTMENT_OTHER): Payer: Medicare Other

## 2017-05-06 ENCOUNTER — Encounter (HOSPITAL_BASED_OUTPATIENT_CLINIC_OR_DEPARTMENT_OTHER): Payer: Self-pay | Admitting: *Deleted

## 2017-05-06 DIAGNOSIS — K219 Gastro-esophageal reflux disease without esophagitis: Secondary | ICD-10-CM

## 2017-05-06 DIAGNOSIS — Z79899 Other long term (current) drug therapy: Secondary | ICD-10-CM | POA: Insufficient documentation

## 2017-05-06 DIAGNOSIS — Z87891 Personal history of nicotine dependence: Secondary | ICD-10-CM | POA: Diagnosis not present

## 2017-05-06 DIAGNOSIS — Z7982 Long term (current) use of aspirin: Secondary | ICD-10-CM | POA: Diagnosis not present

## 2017-05-06 DIAGNOSIS — I10 Essential (primary) hypertension: Secondary | ICD-10-CM | POA: Diagnosis not present

## 2017-05-06 DIAGNOSIS — R1013 Epigastric pain: Secondary | ICD-10-CM | POA: Diagnosis present

## 2017-05-06 DIAGNOSIS — R072 Precordial pain: Secondary | ICD-10-CM | POA: Diagnosis not present

## 2017-05-06 LAB — CBC
HCT: 42.7 % (ref 39.0–52.0)
Hemoglobin: 14.3 g/dL (ref 13.0–17.0)
MCH: 30.4 pg (ref 26.0–34.0)
MCHC: 33.5 g/dL (ref 30.0–36.0)
MCV: 90.7 fL (ref 78.0–100.0)
Platelets: 181 10*3/uL (ref 150–400)
RBC: 4.71 MIL/uL (ref 4.22–5.81)
RDW: 14 % (ref 11.5–15.5)
WBC: 6.8 10*3/uL (ref 4.0–10.5)

## 2017-05-06 LAB — BASIC METABOLIC PANEL
Anion gap: 10 (ref 5–15)
BUN: 29 mg/dL — ABNORMAL HIGH (ref 6–20)
CO2: 24 mmol/L (ref 22–32)
Calcium: 9 mg/dL (ref 8.9–10.3)
Chloride: 106 mmol/L (ref 101–111)
Creatinine, Ser: 1.02 mg/dL (ref 0.61–1.24)
GFR calc Af Amer: >60 mL/min (ref >60)
GFR calc non Af Amer: >60 mL/min (ref >60)
Glucose, Bld: 109 mg/dL — ABNORMAL HIGH (ref 65–99)
Potassium: 3.9 mmol/L (ref 3.5–5.1)
Sodium: 140 mmol/L (ref 135–145)

## 2017-05-06 LAB — TROPONIN I: Troponin I: <0.03 ng/mL (ref <0.03)

## 2017-05-06 MED ORDER — GI COCKTAIL ~~LOC~~
30.0000 mL | Freq: Once | ORAL | Status: AC
  Administered 2017-05-06: 30 mL via ORAL
  Filled 2017-05-06: qty 30

## 2017-05-06 NOTE — ED Triage Notes (Addendum)
C/o epigastric burning, constant, onset 30 minutes ago, some relief with prilosec and milk taken at onset, rates pain 4-5/10, denies other sx. H/o similar, "but not this bad". Mentions ETOH yesterday evening.

## 2017-05-06 NOTE — ED Provider Notes (Signed)
[facility] Provider Note   CSN: [id number] Arrival date & time: 05/06/17  0209     History   Chief Complaint Chief Complaint  Patient presents with  . Abdominal Pain    HPI Steven Hardin is a 71 y.o. male.  HPI Patient is a 71 year old male who states he awoke with severe epigastric burning is been constant.  He tried Prilosec at home and did drink some milk.  He is feeling slightly better at this time but still feels a burning sensation in his mid anterior chest.  No shortness of breath.  No radiation of his discomfort.  No neck or jaw or arm pain/pressure.  No prior history of cardiac disease.  He does have a history of hypertension hyperlipidemia.  No tobacco abuse.  He's been in his normal state health this week.  No difference in his diet.   Past Medical History:  Diagnosis Date  . Diverticulitis   . Hyperlipidemia   . Hypertension     Patient Active Problem List   Diagnosis Date Noted  . Ischemic colitis ([place]) 01/31/2012    Past Surgical History:  Procedure Laterality Date  . bone spur     in both shoulders  . CARPAL TUNNEL RELEASE    . KNEE ARTHROSCOPY AND ARTHROTOMY  right knee  . TONSILLECTOMY         Home Medications    Prior to Admission medications   Medication Sig Start Date End Date Taking? Authorizing Provider  aspirin 81 MG tablet Take 81 mg by mouth daily.    [provider]  metoprolol succinate (TOPROL-XL) 100 MG 24 hr tablet Take 100 mg by mouth daily. Take with or immediately following a meal.    [provider]  Omeprazole (PRILOSEC PO) Take 1 capsule by mouth daily.    [provider]  simvastatin (ZOCOR) 40 MG tablet Take 40 mg by mouth every evening.    [provider]    Family History Family History  Problem Relation Age of Onset  . Cancer Father        esophagus  . Leukemia Sister        twin/died from    Social History Social History  Substance Use Topics  . Smoking  status: Former Smoker    Quit date: 09/25/1984  . Smokeless tobacco: Never Used  . Alcohol use 16.8 oz/week    28 Cans of beer per week     Allergies   Patient has no known allergies.   Review of Systems Review of Systems  All other systems reviewed and are negative.    Physical Exam Updated Vital Signs BP 136/69   Pulse 62   Temp 98.6 F (37 C) (Oral)   Resp 14   SpO2 97%   Physical Exam  Constitutional: He is oriented to person, place, and time. He appears well-developed and well-nourished.  HENT:  Head: Normocephalic and atraumatic.  Eyes: EOM are normal.  Neck: Normal range of motion.  Cardiovascular: Normal rate, regular rhythm, normal heart sounds and intact distal pulses.   Pulmonary/Chest: Effort normal and breath sounds normal. No respiratory distress.  Abdominal: Soft. He exhibits no distension. There is no tenderness.  Musculoskeletal: Normal range of motion.  Neurological: He is alert and oriented to person, place, and time.  Skin: Skin is warm and dry.  Psychiatric: He has a normal mood and affect. Judgment normal.  Nursing note and vitals reviewed.    ED Treatments / Results  Labs (all labs ordered are listed, but only abnormal results are displayed) Labs Reviewed  BASIC METABOLIC PANEL - Abnormal; Notable for the following:       Result Value   Glucose, Bld 109 (*)    BUN 29 (*)    All other components within normal limits  CBC  TROPONIN I    EKG  EKG Interpretation  Date/Time:  Sunday May 06 2017 02:13:50 EDT Ventricular Rate:  69 PR Interval:    QRS Duration: 78 QT Interval:  408 QTC Calculation: 438 R Axis:   17 Text Interpretation:  Sinus rhythm Baseline wander in lead(s) V3 V4 V5 V6 No old tracing to compare Confirmed by Jola Schmidt [phone]) on 05/06/2017 2:17:01 AM       Radiology Dg Chest 2 View  Result Date: 05/06/2017 CLINICAL DATA:  Acute onset of chest burning and cough. Initial encounter. EXAM: CHEST  2 VIEW  COMPARISON:  None. FINDINGS: The lungs are well-aerated and clear. There is no evidence of focal opacification, pleural effusion or pneumothorax. The heart is normal in size; the mediastinal contour is within normal limits. No acute osseous abnormalities are seen. IMPRESSION: No acute cardiopulmonary process seen. Electronically Signed   By: Garald Balding M.D.   On: 05/06/2017 03:04    Procedures Procedures (including critical care time)  Medications Ordered in ED Medications  gi cocktail (Maalox,Lidocaine,Donnatal) (30 mLs Oral Given 05/06/17 0240)     Initial Impression / Assessment and Plan / ED Course  I have reviewed the triage vital signs and the nursing notes.  Pertinent labs & imaging results that were available during my care of the patient were reviewed by me and considered in my medical decision making (see chart for details).     Feels much better after GI cocktail.  I believe this is gastroesophageal reflux disease.  Doubt ACS.  EKG without ischemic changes.  Troponin negative.  Chest x-ray clear.  Patient understands return to the ER for new or worsening symptoms.  I recommended that he see a gastroenterologist as he may benefit from endoscopy as his been on Prilosec for many years and is followed did die of esophageal cancer.  Patient understands return to the ER for new or worsening symptoms  Final Clinical Impressions(s) / ED Diagnoses   Final diagnoses:  Precordial chest pain  Gastroesophageal reflux disease, esophagitis presence not specified    New Prescriptions New Prescriptions   No medications on file     Jola Schmidt, MD 05/06/17 [phone]

## 2017-05-06 NOTE — ED Notes (Signed)
Alert, NAD, calm, interactive, resps e/u, speaking in clear complete sentences, no dyspnea noted, skin W&D, VSS, c/o epigastric burning, (denies: sob, nausea, dizziness or visual changes). Family at [facility].

## 2017-05-06 NOTE — ED Notes (Signed)
Denies questions or needs, "feel better", steady gait, out with wife, VSS.

## 2017-10-30 DIAGNOSIS — H02132 Senile ectropion of right lower eyelid: Secondary | ICD-10-CM | POA: Diagnosis not present

## 2017-10-30 DIAGNOSIS — H02135 Senile ectropion of left lower eyelid: Secondary | ICD-10-CM | POA: Diagnosis not present

## 2017-10-30 DIAGNOSIS — H2513 Age-related nuclear cataract, bilateral: Secondary | ICD-10-CM | POA: Diagnosis not present

## 2017-11-01 DIAGNOSIS — R351 Nocturia: Secondary | ICD-10-CM | POA: Diagnosis not present

## 2017-11-01 DIAGNOSIS — N401 Enlarged prostate with lower urinary tract symptoms: Secondary | ICD-10-CM | POA: Diagnosis not present

## 2018-01-04 IMAGING — CR DG CHEST 2V
2 series · 2 of 2 positions shown · non-contrast
Comparison: None.

CLINICAL DATA: Acute onset of chest burning and cough. Initial
encounter.

EXAM:
CHEST  2 VIEW

[w chest pa]
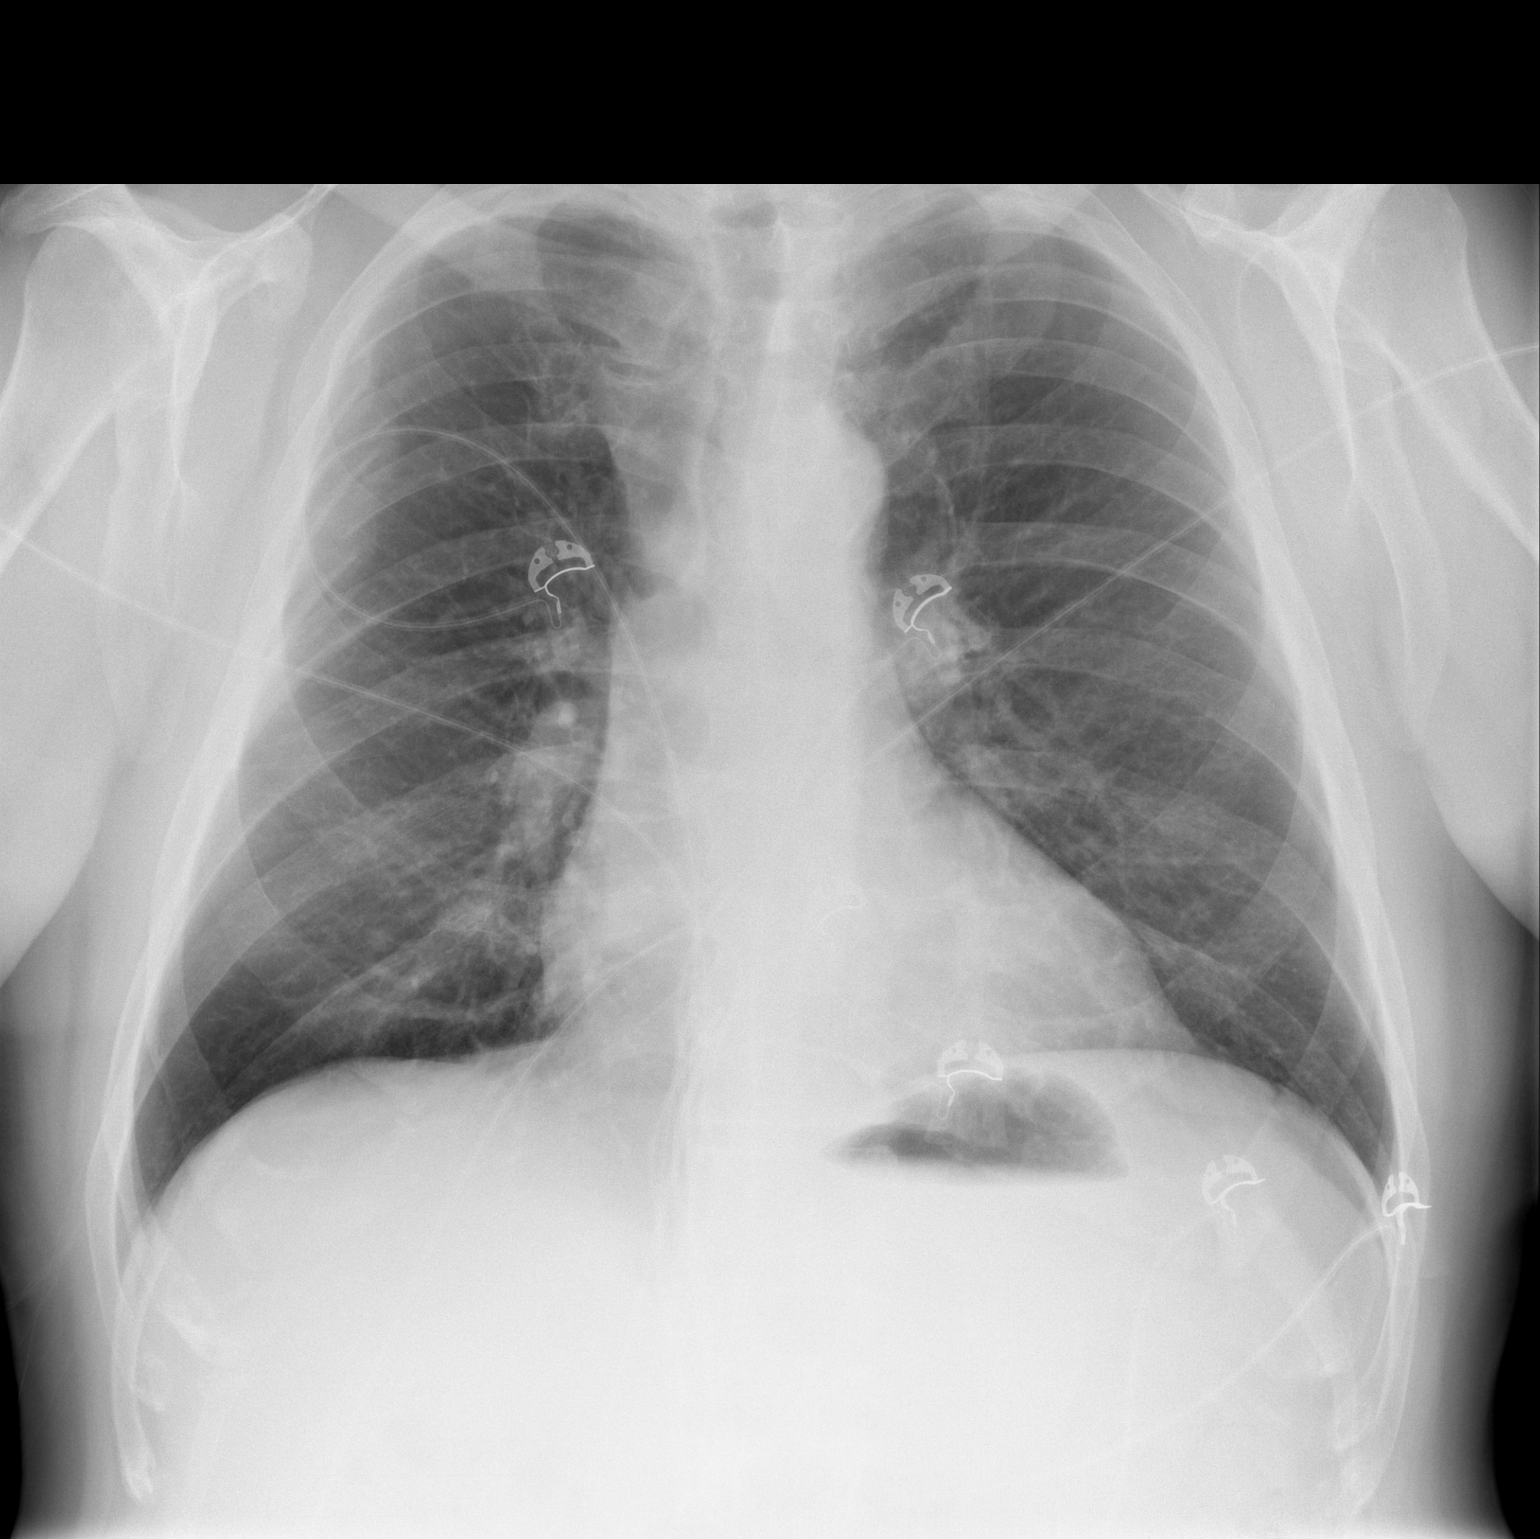

[w chest lat]
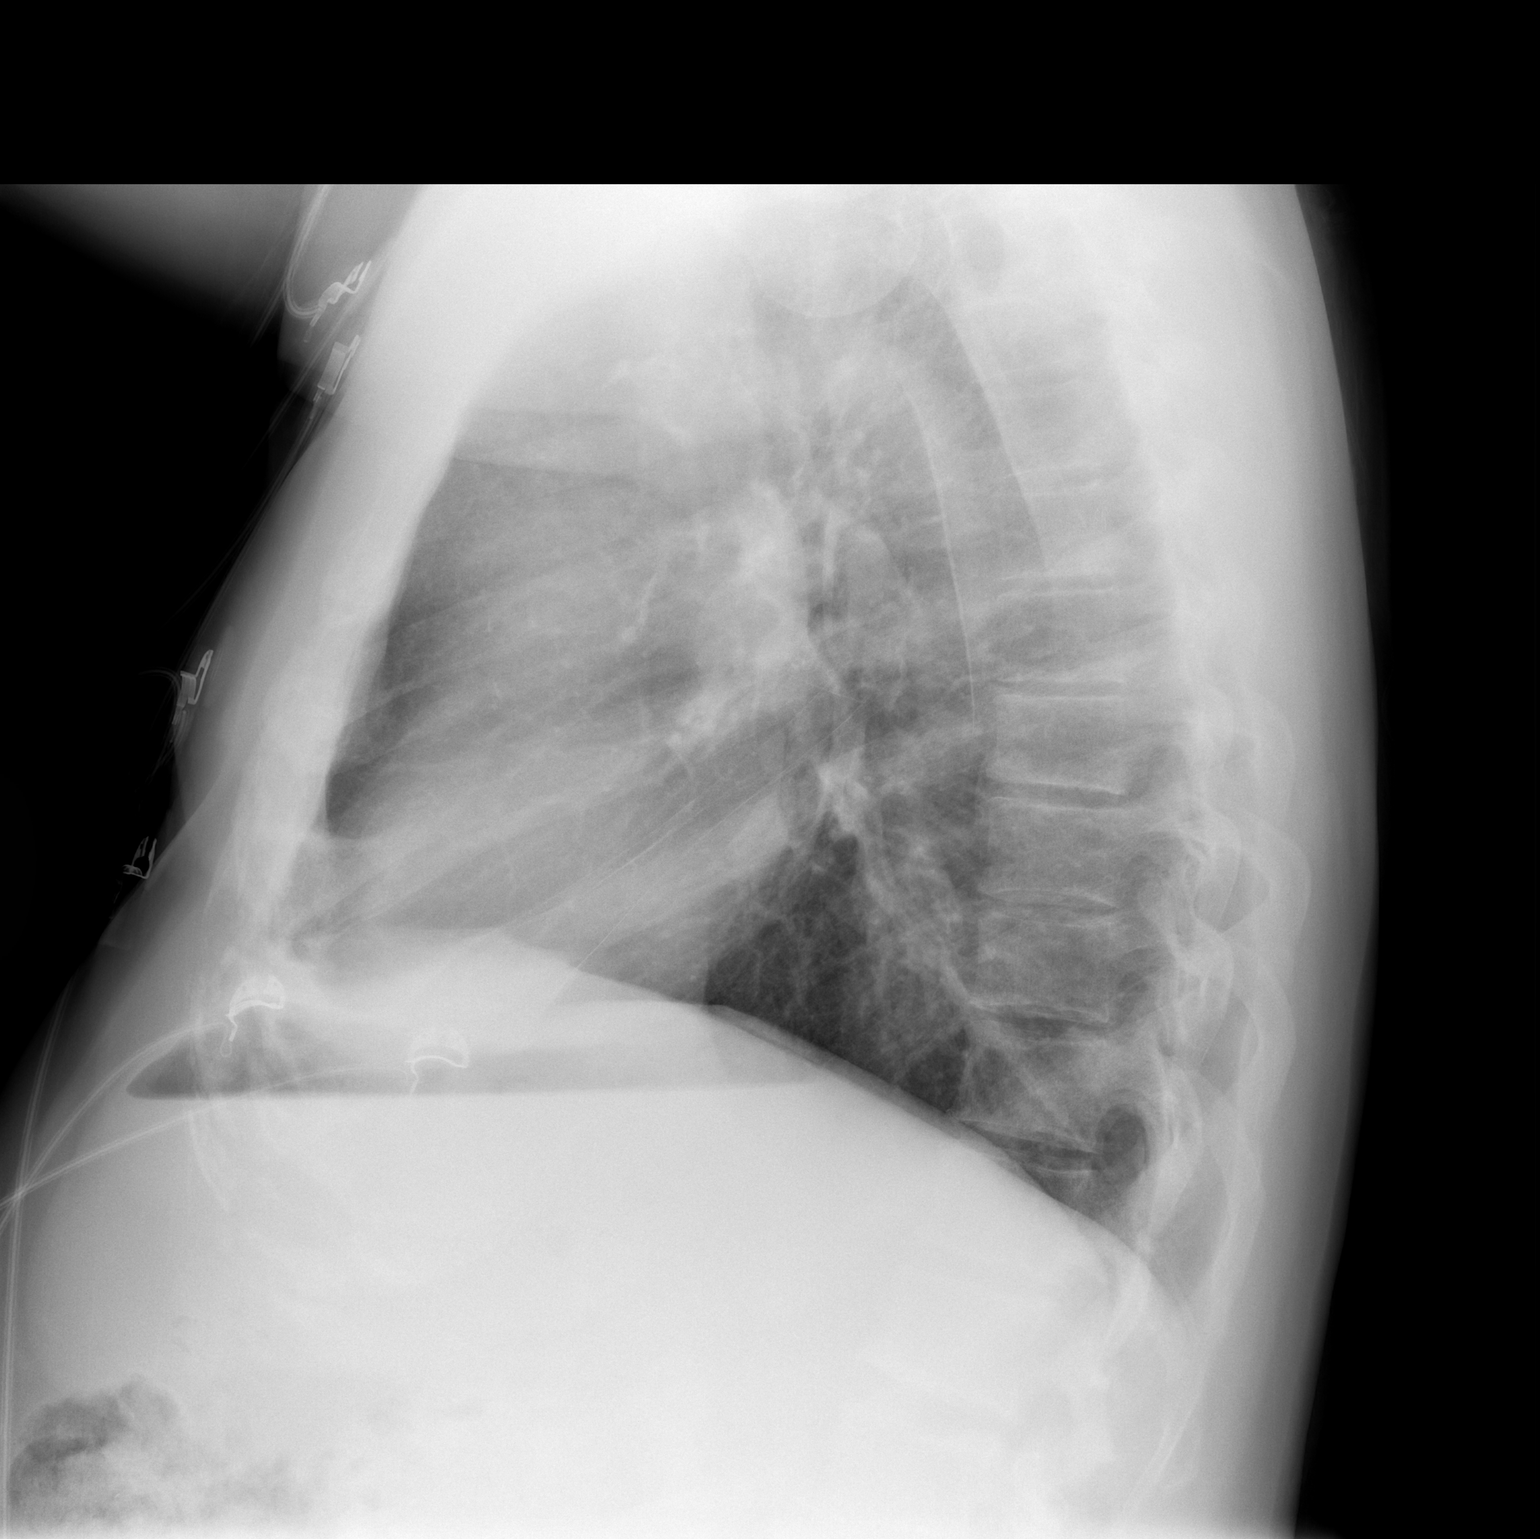

[2 of 2 positions shown; findings below may reference images not displayed]

FINDINGS: The lungs are well-aerated and clear. There is no evidence of focal
opacification, pleural effusion or pneumothorax.

The heart is normal in size; the mediastinal contour is within
normal limits. No acute osseous abnormalities are seen.
IMPRESSION: No acute cardiopulmonary process seen.

## 2018-02-12 DIAGNOSIS — I10 Essential (primary) hypertension: Secondary | ICD-10-CM | POA: Diagnosis not present

## 2018-02-12 DIAGNOSIS — K219 Gastro-esophageal reflux disease without esophagitis: Secondary | ICD-10-CM | POA: Diagnosis not present

## 2018-02-12 DIAGNOSIS — E785 Hyperlipidemia, unspecified: Secondary | ICD-10-CM | POA: Diagnosis not present

## 2018-02-12 DIAGNOSIS — N401 Enlarged prostate with lower urinary tract symptoms: Secondary | ICD-10-CM | POA: Diagnosis not present

## 2018-02-12 DIAGNOSIS — Z125 Encounter for screening for malignant neoplasm of prostate: Secondary | ICD-10-CM | POA: Diagnosis not present

## 2018-02-25 DIAGNOSIS — E785 Hyperlipidemia, unspecified: Secondary | ICD-10-CM | POA: Diagnosis not present

## 2018-02-25 DIAGNOSIS — N401 Enlarged prostate with lower urinary tract symptoms: Secondary | ICD-10-CM | POA: Diagnosis not present

## 2018-02-25 DIAGNOSIS — I10 Essential (primary) hypertension: Secondary | ICD-10-CM | POA: Diagnosis not present

## 2018-02-25 DIAGNOSIS — K219 Gastro-esophageal reflux disease without esophagitis: Secondary | ICD-10-CM | POA: Diagnosis not present

## 2018-02-28 DIAGNOSIS — D1801 Hemangioma of skin and subcutaneous tissue: Secondary | ICD-10-CM | POA: Diagnosis not present

## 2018-02-28 DIAGNOSIS — L570 Actinic keratosis: Secondary | ICD-10-CM | POA: Diagnosis not present

## 2018-02-28 DIAGNOSIS — Z85828 Personal history of other malignant neoplasm of skin: Secondary | ICD-10-CM | POA: Diagnosis not present

## 2018-02-28 DIAGNOSIS — Z08 Encounter for follow-up examination after completed treatment for malignant neoplasm: Secondary | ICD-10-CM | POA: Diagnosis not present

## 2018-02-28 DIAGNOSIS — L578 Other skin changes due to chronic exposure to nonionizing radiation: Secondary | ICD-10-CM | POA: Diagnosis not present

## 2018-07-23 DIAGNOSIS — E785 Hyperlipidemia, unspecified: Secondary | ICD-10-CM | POA: Diagnosis not present

## 2018-07-23 DIAGNOSIS — M67441 Ganglion, right hand: Secondary | ICD-10-CM | POA: Diagnosis not present

## 2018-07-23 DIAGNOSIS — Z23 Encounter for immunization: Secondary | ICD-10-CM | POA: Diagnosis not present

## 2018-07-23 DIAGNOSIS — I10 Essential (primary) hypertension: Secondary | ICD-10-CM | POA: Diagnosis not present

## 2018-07-23 DIAGNOSIS — K219 Gastro-esophageal reflux disease without esophagitis: Secondary | ICD-10-CM | POA: Diagnosis not present

## 2018-07-23 DIAGNOSIS — N401 Enlarged prostate with lower urinary tract symptoms: Secondary | ICD-10-CM | POA: Diagnosis not present

## 2018-08-01 DIAGNOSIS — M1710 Unilateral primary osteoarthritis, unspecified knee: Secondary | ICD-10-CM | POA: Diagnosis not present

## 2018-08-01 DIAGNOSIS — M25561 Pain in right knee: Secondary | ICD-10-CM | POA: Diagnosis not present

## 2018-08-19 DIAGNOSIS — M67441 Ganglion, right hand: Secondary | ICD-10-CM | POA: Diagnosis not present

## 2018-08-19 DIAGNOSIS — M19041 Primary osteoarthritis, right hand: Secondary | ICD-10-CM | POA: Diagnosis not present

## 2018-08-19 DIAGNOSIS — M151 Heberden's nodes (with arthropathy): Secondary | ICD-10-CM | POA: Diagnosis not present

## 2018-08-19 DIAGNOSIS — M79644 Pain in right finger(s): Secondary | ICD-10-CM | POA: Diagnosis not present

## 2018-08-20 DIAGNOSIS — K219 Gastro-esophageal reflux disease without esophagitis: Secondary | ICD-10-CM | POA: Diagnosis not present

## 2018-08-20 DIAGNOSIS — E785 Hyperlipidemia, unspecified: Secondary | ICD-10-CM | POA: Diagnosis not present

## 2018-08-20 DIAGNOSIS — Z01818 Encounter for other preprocedural examination: Secondary | ICD-10-CM | POA: Diagnosis not present

## 2018-08-20 DIAGNOSIS — N401 Enlarged prostate with lower urinary tract symptoms: Secondary | ICD-10-CM | POA: Diagnosis not present

## 2018-08-20 DIAGNOSIS — I10 Essential (primary) hypertension: Secondary | ICD-10-CM | POA: Diagnosis not present

## 2018-09-16 DIAGNOSIS — M1711 Unilateral primary osteoarthritis, right knee: Secondary | ICD-10-CM | POA: Diagnosis not present

## 2018-09-16 DIAGNOSIS — R001 Bradycardia, unspecified: Secondary | ICD-10-CM | POA: Diagnosis not present

## 2018-09-16 DIAGNOSIS — Z01810 Encounter for preprocedural cardiovascular examination: Secondary | ICD-10-CM | POA: Diagnosis not present

## 2018-09-16 DIAGNOSIS — Z01812 Encounter for preprocedural laboratory examination: Secondary | ICD-10-CM | POA: Diagnosis not present

## 2018-09-26 DIAGNOSIS — G8929 Other chronic pain: Secondary | ICD-10-CM | POA: Diagnosis not present

## 2018-09-26 DIAGNOSIS — M25561 Pain in right knee: Secondary | ICD-10-CM | POA: Diagnosis not present

## 2018-09-26 DIAGNOSIS — M1710 Unilateral primary osteoarthritis, unspecified knee: Secondary | ICD-10-CM | POA: Diagnosis not present

## 2018-10-01 DIAGNOSIS — I10 Essential (primary) hypertension: Secondary | ICD-10-CM | POA: Diagnosis not present

## 2018-10-01 DIAGNOSIS — K219 Gastro-esophageal reflux disease without esophagitis: Secondary | ICD-10-CM | POA: Diagnosis not present

## 2018-10-01 DIAGNOSIS — Z6830 Body mass index (BMI) 30.0-30.9, adult: Secondary | ICD-10-CM | POA: Diagnosis not present

## 2018-10-01 DIAGNOSIS — Z87891 Personal history of nicotine dependence: Secondary | ICD-10-CM | POA: Diagnosis not present

## 2018-10-01 DIAGNOSIS — E669 Obesity, unspecified: Secondary | ICD-10-CM | POA: Diagnosis not present

## 2018-10-01 DIAGNOSIS — Z96651 Presence of right artificial knee joint: Secondary | ICD-10-CM | POA: Diagnosis not present

## 2018-10-01 DIAGNOSIS — G8918 Other acute postprocedural pain: Secondary | ICD-10-CM | POA: Diagnosis not present

## 2018-10-01 DIAGNOSIS — F329 Major depressive disorder, single episode, unspecified: Secondary | ICD-10-CM | POA: Diagnosis not present

## 2018-10-01 DIAGNOSIS — N400 Enlarged prostate without lower urinary tract symptoms: Secondary | ICD-10-CM | POA: Diagnosis not present

## 2018-10-01 DIAGNOSIS — Z471 Aftercare following joint replacement surgery: Secondary | ICD-10-CM | POA: Diagnosis not present

## 2018-10-01 DIAGNOSIS — Z7982 Long term (current) use of aspirin: Secondary | ICD-10-CM | POA: Diagnosis not present

## 2018-10-01 DIAGNOSIS — E785 Hyperlipidemia, unspecified: Secondary | ICD-10-CM | POA: Diagnosis not present

## 2018-10-01 DIAGNOSIS — M1711 Unilateral primary osteoarthritis, right knee: Secondary | ICD-10-CM | POA: Diagnosis not present

## 2018-10-03 DIAGNOSIS — M25561 Pain in right knee: Secondary | ICD-10-CM | POA: Diagnosis not present

## 2018-10-03 DIAGNOSIS — G8929 Other chronic pain: Secondary | ICD-10-CM | POA: Diagnosis not present

## 2018-10-03 DIAGNOSIS — R2689 Other abnormalities of gait and mobility: Secondary | ICD-10-CM | POA: Diagnosis not present

## 2018-10-03 DIAGNOSIS — M25661 Stiffness of right knee, not elsewhere classified: Secondary | ICD-10-CM | POA: Diagnosis not present

## 2018-10-03 DIAGNOSIS — M1710 Unilateral primary osteoarthritis, unspecified knee: Secondary | ICD-10-CM | POA: Diagnosis not present

## 2018-10-07 DIAGNOSIS — M25661 Stiffness of right knee, not elsewhere classified: Secondary | ICD-10-CM | POA: Diagnosis not present

## 2018-10-07 DIAGNOSIS — G8929 Other chronic pain: Secondary | ICD-10-CM | POA: Diagnosis not present

## 2018-10-07 DIAGNOSIS — M25561 Pain in right knee: Secondary | ICD-10-CM | POA: Diagnosis not present

## 2018-10-07 DIAGNOSIS — M1710 Unilateral primary osteoarthritis, unspecified knee: Secondary | ICD-10-CM | POA: Diagnosis not present

## 2018-10-07 DIAGNOSIS — R2689 Other abnormalities of gait and mobility: Secondary | ICD-10-CM | POA: Diagnosis not present

## 2018-10-10 DIAGNOSIS — M25561 Pain in right knee: Secondary | ICD-10-CM | POA: Diagnosis not present

## 2018-10-10 DIAGNOSIS — M1710 Unilateral primary osteoarthritis, unspecified knee: Secondary | ICD-10-CM | POA: Diagnosis not present

## 2018-10-10 DIAGNOSIS — M25661 Stiffness of right knee, not elsewhere classified: Secondary | ICD-10-CM | POA: Diagnosis not present

## 2018-10-10 DIAGNOSIS — G8929 Other chronic pain: Secondary | ICD-10-CM | POA: Diagnosis not present

## 2018-10-10 DIAGNOSIS — R2689 Other abnormalities of gait and mobility: Secondary | ICD-10-CM | POA: Diagnosis not present

## 2018-10-16 DIAGNOSIS — R2689 Other abnormalities of gait and mobility: Secondary | ICD-10-CM | POA: Diagnosis not present

## 2018-10-16 DIAGNOSIS — G8929 Other chronic pain: Secondary | ICD-10-CM | POA: Diagnosis not present

## 2018-10-16 DIAGNOSIS — M25661 Stiffness of right knee, not elsewhere classified: Secondary | ICD-10-CM | POA: Diagnosis not present

## 2018-10-16 DIAGNOSIS — M25561 Pain in right knee: Secondary | ICD-10-CM | POA: Diagnosis not present

## 2018-10-18 DIAGNOSIS — R2689 Other abnormalities of gait and mobility: Secondary | ICD-10-CM | POA: Diagnosis not present

## 2018-10-18 DIAGNOSIS — M25661 Stiffness of right knee, not elsewhere classified: Secondary | ICD-10-CM | POA: Diagnosis not present

## 2018-10-18 DIAGNOSIS — G8929 Other chronic pain: Secondary | ICD-10-CM | POA: Diagnosis not present

## 2018-10-18 DIAGNOSIS — M25561 Pain in right knee: Secondary | ICD-10-CM | POA: Diagnosis not present

## 2018-10-22 DIAGNOSIS — M25661 Stiffness of right knee, not elsewhere classified: Secondary | ICD-10-CM | POA: Diagnosis not present

## 2018-10-22 DIAGNOSIS — G8929 Other chronic pain: Secondary | ICD-10-CM | POA: Diagnosis not present

## 2018-10-22 DIAGNOSIS — M25561 Pain in right knee: Secondary | ICD-10-CM | POA: Diagnosis not present

## 2018-10-22 DIAGNOSIS — R2689 Other abnormalities of gait and mobility: Secondary | ICD-10-CM | POA: Diagnosis not present

## 2018-10-24 DIAGNOSIS — G8929 Other chronic pain: Secondary | ICD-10-CM | POA: Diagnosis not present

## 2018-10-24 DIAGNOSIS — M25661 Stiffness of right knee, not elsewhere classified: Secondary | ICD-10-CM | POA: Diagnosis not present

## 2018-10-24 DIAGNOSIS — M25561 Pain in right knee: Secondary | ICD-10-CM | POA: Diagnosis not present

## 2018-10-24 DIAGNOSIS — R2689 Other abnormalities of gait and mobility: Secondary | ICD-10-CM | POA: Diagnosis not present

## 2018-10-31 DIAGNOSIS — M25561 Pain in right knee: Secondary | ICD-10-CM | POA: Diagnosis not present

## 2018-10-31 DIAGNOSIS — G8929 Other chronic pain: Secondary | ICD-10-CM | POA: Diagnosis not present

## 2018-10-31 DIAGNOSIS — R2689 Other abnormalities of gait and mobility: Secondary | ICD-10-CM | POA: Diagnosis not present

## 2018-10-31 DIAGNOSIS — M25661 Stiffness of right knee, not elsewhere classified: Secondary | ICD-10-CM | POA: Diagnosis not present

## 2018-11-04 DIAGNOSIS — R2689 Other abnormalities of gait and mobility: Secondary | ICD-10-CM | POA: Diagnosis not present

## 2018-11-04 DIAGNOSIS — M25661 Stiffness of right knee, not elsewhere classified: Secondary | ICD-10-CM | POA: Diagnosis not present

## 2018-11-04 DIAGNOSIS — G8929 Other chronic pain: Secondary | ICD-10-CM | POA: Diagnosis not present

## 2018-11-04 DIAGNOSIS — M25561 Pain in right knee: Secondary | ICD-10-CM | POA: Diagnosis not present

## 2018-11-07 DIAGNOSIS — N179 Acute kidney failure, unspecified: Secondary | ICD-10-CM | POA: Diagnosis not present

## 2018-11-07 DIAGNOSIS — Z79891 Long term (current) use of opiate analgesic: Secondary | ICD-10-CM | POA: Diagnosis not present

## 2018-11-07 DIAGNOSIS — R319 Hematuria, unspecified: Secondary | ICD-10-CM | POA: Diagnosis not present

## 2018-11-07 DIAGNOSIS — N401 Enlarged prostate with lower urinary tract symptoms: Secondary | ICD-10-CM | POA: Diagnosis not present

## 2018-11-07 DIAGNOSIS — N390 Urinary tract infection, site not specified: Secondary | ICD-10-CM | POA: Diagnosis not present

## 2018-11-07 DIAGNOSIS — E860 Dehydration: Secondary | ICD-10-CM | POA: Diagnosis not present

## 2018-11-07 DIAGNOSIS — Z7982 Long term (current) use of aspirin: Secondary | ICD-10-CM | POA: Diagnosis not present

## 2018-11-07 DIAGNOSIS — I10 Essential (primary) hypertension: Secondary | ICD-10-CM | POA: Diagnosis not present

## 2018-11-07 DIAGNOSIS — R338 Other retention of urine: Secondary | ICD-10-CM | POA: Diagnosis not present

## 2018-11-07 DIAGNOSIS — R531 Weakness: Secondary | ICD-10-CM | POA: Diagnosis not present

## 2018-11-07 DIAGNOSIS — Z79899 Other long term (current) drug therapy: Secondary | ICD-10-CM | POA: Diagnosis not present

## 2018-11-08 DIAGNOSIS — N179 Acute kidney failure, unspecified: Secondary | ICD-10-CM | POA: Diagnosis not present

## 2018-11-08 DIAGNOSIS — E860 Dehydration: Secondary | ICD-10-CM | POA: Diagnosis not present

## 2018-11-08 DIAGNOSIS — R319 Hematuria, unspecified: Secondary | ICD-10-CM | POA: Diagnosis not present

## 2018-11-11 DIAGNOSIS — G8929 Other chronic pain: Secondary | ICD-10-CM | POA: Diagnosis not present

## 2018-11-11 DIAGNOSIS — M25561 Pain in right knee: Secondary | ICD-10-CM | POA: Diagnosis not present

## 2018-11-11 DIAGNOSIS — R2689 Other abnormalities of gait and mobility: Secondary | ICD-10-CM | POA: Diagnosis not present

## 2018-11-11 DIAGNOSIS — M25661 Stiffness of right knee, not elsewhere classified: Secondary | ICD-10-CM | POA: Diagnosis not present

## 2018-11-12 ENCOUNTER — Encounter: Payer: Self-pay | Admitting: *Deleted

## 2018-11-12 ENCOUNTER — Other Ambulatory Visit: Payer: Self-pay | Admitting: *Deleted

## 2018-11-12 NOTE — Patient Outreach (Signed)
[facility]) Care Management  11/12/2018  Michiel Sivley 1946/04/10 [id number]   Subjective: Telephone call to patient's home number, no answer, message states invalid number, and unable to leave a message. Telephone call to patient's home / mobile number, spoke with patient, times 2, and HIPAA verified.  Discussed [facility] Care Management [employer] Transition of care follow up, patient voiced understanding, and is in agreement to follow up.  Patient states he is doing good, still receiving outpatient physical therapy for recent knee surgery, therapy going well, has left a message with primary MD's office regarding a follow up appointment, and has not received a call back.  Patient requested this RNCM assistance in arranging a follow up appointment with primary MD due to his past experience with primary's office not calling back in a timely fashion.  Patient states he was discharged from the hospital on 11/08/2018, blood pressure medication (Losartan) was discontinued due to low blood pressure readings while inpatient ( BP 70 /45), and wants to know when he should resume medications, and now blood pressure readings averaging around his baseline 135 / 75-80.  RNCM advised patient of signs / symptoms to report to MD, patient voices understanding, and states he will follow up. RNCM advised patient will contact primary MD's office regarding follow up appointment and his questions regarding blood pressure medication, patient voices understanding, is appreciative of assistance.  Patient states he is able to manage self care and has assistance as needed.  Patient voices understanding of medical diagnosis and treatment plan.  Patient states he does not have any education material, transition of care, care coordination, disease management, disease monitoring, transportation, community resource, or pharmacy needs at this time. States he is very appreciative of the follow up and is in agreement to  receive [facility] information.   Telephone call to patient's primary MD's office (Dr. Jeralene Huff), spoke with Jeannene Patella, advised of above conversation with patient.  Pam states they are waiting for Dr. Jeralene Huff to respond to the message from patient regarding follow up appointment.   Pam states patient should receive a call back today regarding scheduling follow up appointment and has documented patient's questions regarding blood pressure medication. Pam also given this RNCM's contact information for questions if needed.  Telephone call to patient advised of above conversation with Dr. Royal Piedra office, patient will follow up with Dr.Kalish's office on 11/13/2018 if no call back received from the office, patient voices understanding, is in agreement, and appreciative of the follow up.    Objective: Per KPN ([employer], point of care tool) and chart review, patient hospitalized 11/07/2018 -11/08/2018 for UTI, Acute Kidney Injury.   Patient also has a history of hypertension, diverticulitis, hyperlipidemia, and ischemic colitis.     Assessment: Received [employer] Transition of care referral on 11/12/2018.   Transition of care follow up completed, no care management needs, and will proceed with case closure.      Plan: RNCM will send patient successful outreach letter, [facility] pamphlet, and magnet. RNCM will complete case closure due to follow up completed / no care management needs.  RNCM will send MD case closure letter.      Abisai Coble H. Annia Friendly, BSN, [facility] Telephonic CM Phone: [phone] Fax: [phone]

## 2018-11-14 DIAGNOSIS — M25561 Pain in right knee: Secondary | ICD-10-CM | POA: Diagnosis not present

## 2018-11-14 DIAGNOSIS — R2689 Other abnormalities of gait and mobility: Secondary | ICD-10-CM | POA: Diagnosis not present

## 2018-11-14 DIAGNOSIS — G8929 Other chronic pain: Secondary | ICD-10-CM | POA: Diagnosis not present

## 2018-11-14 DIAGNOSIS — M25661 Stiffness of right knee, not elsewhere classified: Secondary | ICD-10-CM | POA: Diagnosis not present

## 2018-11-15 DIAGNOSIS — Z87891 Personal history of nicotine dependence: Secondary | ICD-10-CM | POA: Diagnosis not present

## 2018-11-15 DIAGNOSIS — Z8679 Personal history of other diseases of the circulatory system: Secondary | ICD-10-CM | POA: Diagnosis not present

## 2018-11-15 DIAGNOSIS — R350 Frequency of micturition: Secondary | ICD-10-CM | POA: Diagnosis not present

## 2018-11-15 DIAGNOSIS — Z09 Encounter for follow-up examination after completed treatment for conditions other than malignant neoplasm: Secondary | ICD-10-CM | POA: Diagnosis not present

## 2018-11-15 DIAGNOSIS — R5383 Other fatigue: Secondary | ICD-10-CM | POA: Diagnosis not present

## 2018-11-15 DIAGNOSIS — R310 Gross hematuria: Secondary | ICD-10-CM | POA: Diagnosis not present

## 2018-11-15 DIAGNOSIS — Z79899 Other long term (current) drug therapy: Secondary | ICD-10-CM | POA: Diagnosis not present

## 2018-11-15 DIAGNOSIS — F1020 Alcohol dependence, uncomplicated: Secondary | ICD-10-CM | POA: Diagnosis not present

## 2018-11-15 DIAGNOSIS — D539 Nutritional anemia, unspecified: Secondary | ICD-10-CM | POA: Diagnosis not present

## 2018-11-15 DIAGNOSIS — N179 Acute kidney failure, unspecified: Secondary | ICD-10-CM | POA: Diagnosis not present

## 2018-11-21 DIAGNOSIS — M25561 Pain in right knee: Secondary | ICD-10-CM | POA: Diagnosis not present

## 2018-11-21 DIAGNOSIS — M25661 Stiffness of right knee, not elsewhere classified: Secondary | ICD-10-CM | POA: Diagnosis not present

## 2018-11-21 DIAGNOSIS — R2689 Other abnormalities of gait and mobility: Secondary | ICD-10-CM | POA: Diagnosis not present

## 2018-11-21 DIAGNOSIS — G8929 Other chronic pain: Secondary | ICD-10-CM | POA: Diagnosis not present

## 2018-11-22 DIAGNOSIS — M25561 Pain in right knee: Secondary | ICD-10-CM | POA: Diagnosis not present

## 2018-11-22 DIAGNOSIS — R2689 Other abnormalities of gait and mobility: Secondary | ICD-10-CM | POA: Diagnosis not present

## 2018-11-22 DIAGNOSIS — G8929 Other chronic pain: Secondary | ICD-10-CM | POA: Diagnosis not present

## 2018-11-22 DIAGNOSIS — M25661 Stiffness of right knee, not elsewhere classified: Secondary | ICD-10-CM | POA: Diagnosis not present

## 2018-11-26 DIAGNOSIS — M25661 Stiffness of right knee, not elsewhere classified: Secondary | ICD-10-CM | POA: Diagnosis not present

## 2018-11-26 DIAGNOSIS — R2689 Other abnormalities of gait and mobility: Secondary | ICD-10-CM | POA: Diagnosis not present

## 2018-11-26 DIAGNOSIS — G8929 Other chronic pain: Secondary | ICD-10-CM | POA: Diagnosis not present

## 2018-11-26 DIAGNOSIS — M25561 Pain in right knee: Secondary | ICD-10-CM | POA: Diagnosis not present

## 2018-11-29 DIAGNOSIS — G8929 Other chronic pain: Secondary | ICD-10-CM | POA: Diagnosis not present

## 2018-11-29 DIAGNOSIS — M25661 Stiffness of right knee, not elsewhere classified: Secondary | ICD-10-CM | POA: Diagnosis not present

## 2018-11-29 DIAGNOSIS — M25561 Pain in right knee: Secondary | ICD-10-CM | POA: Diagnosis not present

## 2018-11-29 DIAGNOSIS — R2689 Other abnormalities of gait and mobility: Secondary | ICD-10-CM | POA: Diagnosis not present

## 2018-12-03 DIAGNOSIS — G8929 Other chronic pain: Secondary | ICD-10-CM | POA: Diagnosis not present

## 2018-12-03 DIAGNOSIS — M25661 Stiffness of right knee, not elsewhere classified: Secondary | ICD-10-CM | POA: Diagnosis not present

## 2018-12-03 DIAGNOSIS — R2689 Other abnormalities of gait and mobility: Secondary | ICD-10-CM | POA: Diagnosis not present

## 2018-12-03 DIAGNOSIS — M25561 Pain in right knee: Secondary | ICD-10-CM | POA: Diagnosis not present

## 2018-12-05 DIAGNOSIS — R2689 Other abnormalities of gait and mobility: Secondary | ICD-10-CM | POA: Diagnosis not present

## 2018-12-05 DIAGNOSIS — M25661 Stiffness of right knee, not elsewhere classified: Secondary | ICD-10-CM | POA: Diagnosis not present

## 2018-12-05 DIAGNOSIS — G8929 Other chronic pain: Secondary | ICD-10-CM | POA: Diagnosis not present

## 2018-12-05 DIAGNOSIS — M25561 Pain in right knee: Secondary | ICD-10-CM | POA: Diagnosis not present

## 2018-12-10 DIAGNOSIS — Z7982 Long term (current) use of aspirin: Secondary | ICD-10-CM | POA: Diagnosis not present

## 2018-12-10 DIAGNOSIS — N401 Enlarged prostate with lower urinary tract symptoms: Secondary | ICD-10-CM | POA: Diagnosis not present

## 2018-12-10 DIAGNOSIS — R2689 Other abnormalities of gait and mobility: Secondary | ICD-10-CM | POA: Diagnosis not present

## 2018-12-10 DIAGNOSIS — M25661 Stiffness of right knee, not elsewhere classified: Secondary | ICD-10-CM | POA: Diagnosis not present

## 2018-12-10 DIAGNOSIS — Z87891 Personal history of nicotine dependence: Secondary | ICD-10-CM | POA: Diagnosis not present

## 2018-12-10 DIAGNOSIS — G8929 Other chronic pain: Secondary | ICD-10-CM | POA: Diagnosis not present

## 2018-12-10 DIAGNOSIS — M25561 Pain in right knee: Secondary | ICD-10-CM | POA: Diagnosis not present

## 2018-12-10 DIAGNOSIS — R351 Nocturia: Secondary | ICD-10-CM | POA: Diagnosis not present

## 2018-12-12 DIAGNOSIS — M25661 Stiffness of right knee, not elsewhere classified: Secondary | ICD-10-CM | POA: Diagnosis not present

## 2018-12-12 DIAGNOSIS — G8929 Other chronic pain: Secondary | ICD-10-CM | POA: Diagnosis not present

## 2018-12-12 DIAGNOSIS — M25561 Pain in right knee: Secondary | ICD-10-CM | POA: Diagnosis not present

## 2018-12-12 DIAGNOSIS — R2689 Other abnormalities of gait and mobility: Secondary | ICD-10-CM | POA: Diagnosis not present

## 2018-12-16 DIAGNOSIS — R2689 Other abnormalities of gait and mobility: Secondary | ICD-10-CM | POA: Diagnosis not present

## 2018-12-16 DIAGNOSIS — M25561 Pain in right knee: Secondary | ICD-10-CM | POA: Diagnosis not present

## 2018-12-16 DIAGNOSIS — G8929 Other chronic pain: Secondary | ICD-10-CM | POA: Diagnosis not present

## 2018-12-16 DIAGNOSIS — M25661 Stiffness of right knee, not elsewhere classified: Secondary | ICD-10-CM | POA: Diagnosis not present

## 2018-12-19 DIAGNOSIS — R55 Syncope and collapse: Secondary | ICD-10-CM | POA: Diagnosis not present

## 2018-12-19 DIAGNOSIS — I700 Atherosclerosis of aorta: Secondary | ICD-10-CM | POA: Diagnosis not present

## 2018-12-19 DIAGNOSIS — K5730 Diverticulosis of large intestine without perforation or abscess without bleeding: Secondary | ICD-10-CM | POA: Diagnosis not present

## 2018-12-19 DIAGNOSIS — N3289 Other specified disorders of bladder: Secondary | ICD-10-CM | POA: Diagnosis not present

## 2018-12-19 DIAGNOSIS — N400 Enlarged prostate without lower urinary tract symptoms: Secondary | ICD-10-CM | POA: Diagnosis not present

## 2018-12-26 DIAGNOSIS — Z8719 Personal history of other diseases of the digestive system: Secondary | ICD-10-CM | POA: Diagnosis not present

## 2018-12-26 DIAGNOSIS — K317 Polyp of stomach and duodenum: Secondary | ICD-10-CM | POA: Diagnosis not present

## 2018-12-26 DIAGNOSIS — Z87448 Personal history of other diseases of urinary system: Secondary | ICD-10-CM | POA: Diagnosis not present

## 2018-12-26 DIAGNOSIS — Z885 Allergy status to narcotic agent status: Secondary | ICD-10-CM | POA: Diagnosis not present

## 2018-12-26 DIAGNOSIS — Z940 Kidney transplant status: Secondary | ICD-10-CM | POA: Diagnosis not present

## 2018-12-26 DIAGNOSIS — I10 Essential (primary) hypertension: Secondary | ICD-10-CM | POA: Diagnosis not present

## 2018-12-26 DIAGNOSIS — N401 Enlarged prostate with lower urinary tract symptoms: Secondary | ICD-10-CM | POA: Diagnosis not present

## 2018-12-26 DIAGNOSIS — D123 Benign neoplasm of transverse colon: Secondary | ICD-10-CM | POA: Diagnosis not present

## 2018-12-26 DIAGNOSIS — Z882 Allergy status to sulfonamides status: Secondary | ICD-10-CM | POA: Diagnosis not present

## 2018-12-26 DIAGNOSIS — Z79899 Other long term (current) drug therapy: Secondary | ICD-10-CM | POA: Diagnosis not present

## 2018-12-26 DIAGNOSIS — J449 Chronic obstructive pulmonary disease, unspecified: Secondary | ICD-10-CM | POA: Diagnosis not present

## 2018-12-26 DIAGNOSIS — E785 Hyperlipidemia, unspecified: Secondary | ICD-10-CM | POA: Diagnosis not present

## 2018-12-26 DIAGNOSIS — K449 Diaphragmatic hernia without obstruction or gangrene: Secondary | ICD-10-CM | POA: Diagnosis not present

## 2018-12-26 DIAGNOSIS — R351 Nocturia: Secondary | ICD-10-CM | POA: Diagnosis not present

## 2018-12-26 DIAGNOSIS — Z888 Allergy status to other drugs, medicaments and biological substances status: Secondary | ICD-10-CM | POA: Diagnosis not present

## 2018-12-26 DIAGNOSIS — K2980 Duodenitis without bleeding: Secondary | ICD-10-CM | POA: Diagnosis not present

## 2018-12-26 DIAGNOSIS — K219 Gastro-esophageal reflux disease without esophagitis: Secondary | ICD-10-CM | POA: Diagnosis not present

## 2018-12-26 DIAGNOSIS — D509 Iron deficiency anemia, unspecified: Secondary | ICD-10-CM | POA: Diagnosis not present

## 2019-01-17 DIAGNOSIS — K219 Gastro-esophageal reflux disease without esophagitis: Secondary | ICD-10-CM | POA: Diagnosis not present

## 2019-01-17 DIAGNOSIS — E785 Hyperlipidemia, unspecified: Secondary | ICD-10-CM | POA: Diagnosis not present

## 2019-01-17 DIAGNOSIS — I10 Essential (primary) hypertension: Secondary | ICD-10-CM | POA: Diagnosis not present

## 2019-01-17 DIAGNOSIS — R351 Nocturia: Secondary | ICD-10-CM | POA: Diagnosis not present

## 2019-01-17 DIAGNOSIS — Z87448 Personal history of other diseases of urinary system: Secondary | ICD-10-CM | POA: Diagnosis not present

## 2019-01-17 DIAGNOSIS — N401 Enlarged prostate with lower urinary tract symptoms: Secondary | ICD-10-CM | POA: Diagnosis not present

## 2019-01-29 DIAGNOSIS — F17290 Nicotine dependence, other tobacco product, uncomplicated: Secondary | ICD-10-CM | POA: Diagnosis not present

## 2019-01-29 DIAGNOSIS — Z13820 Encounter for screening for osteoporosis: Secondary | ICD-10-CM | POA: Diagnosis not present

## 2019-01-29 DIAGNOSIS — F1010 Alcohol abuse, uncomplicated: Secondary | ICD-10-CM | POA: Diagnosis not present

## 2019-01-29 DIAGNOSIS — N401 Enlarged prostate with lower urinary tract symptoms: Secondary | ICD-10-CM | POA: Diagnosis not present

## 2019-01-29 DIAGNOSIS — K219 Gastro-esophageal reflux disease without esophagitis: Secondary | ICD-10-CM | POA: Diagnosis not present

## 2019-01-29 DIAGNOSIS — M25511 Pain in right shoulder: Secondary | ICD-10-CM | POA: Diagnosis not present

## 2019-01-29 DIAGNOSIS — I10 Essential (primary) hypertension: Secondary | ICD-10-CM | POA: Diagnosis not present

## 2019-01-29 DIAGNOSIS — E785 Hyperlipidemia, unspecified: Secondary | ICD-10-CM | POA: Diagnosis not present

## 2019-02-10 DIAGNOSIS — M25511 Pain in right shoulder: Secondary | ICD-10-CM | POA: Diagnosis not present

## 2019-02-10 DIAGNOSIS — M19011 Primary osteoarthritis, right shoulder: Secondary | ICD-10-CM | POA: Diagnosis not present

## 2019-02-10 DIAGNOSIS — M7541 Impingement syndrome of right shoulder: Secondary | ICD-10-CM | POA: Diagnosis not present

## 2019-02-14 DIAGNOSIS — H02132 Senile ectropion of right lower eyelid: Secondary | ICD-10-CM | POA: Diagnosis not present

## 2019-02-14 DIAGNOSIS — H02135 Senile ectropion of left lower eyelid: Secondary | ICD-10-CM | POA: Diagnosis not present

## 2019-02-14 DIAGNOSIS — H2513 Age-related nuclear cataract, bilateral: Secondary | ICD-10-CM | POA: Diagnosis not present

## 2019-03-06 DIAGNOSIS — L578 Other skin changes due to chronic exposure to nonionizing radiation: Secondary | ICD-10-CM | POA: Diagnosis not present

## 2019-03-06 DIAGNOSIS — Z85828 Personal history of other malignant neoplasm of skin: Secondary | ICD-10-CM | POA: Diagnosis not present

## 2019-03-06 DIAGNOSIS — L821 Other seborrheic keratosis: Secondary | ICD-10-CM | POA: Diagnosis not present

## 2019-03-06 DIAGNOSIS — D692 Other nonthrombocytopenic purpura: Secondary | ICD-10-CM | POA: Diagnosis not present

## 2019-03-06 DIAGNOSIS — L570 Actinic keratosis: Secondary | ICD-10-CM | POA: Diagnosis not present

## 2019-03-27 DIAGNOSIS — N400 Enlarged prostate without lower urinary tract symptoms: Secondary | ICD-10-CM | POA: Diagnosis not present

## 2019-04-09 DIAGNOSIS — Z780 Asymptomatic menopausal state: Secondary | ICD-10-CM | POA: Diagnosis not present

## 2019-04-09 DIAGNOSIS — Z13820 Encounter for screening for osteoporosis: Secondary | ICD-10-CM | POA: Diagnosis not present

## 2019-08-22 DIAGNOSIS — E785 Hyperlipidemia, unspecified: Secondary | ICD-10-CM | POA: Diagnosis not present

## 2019-08-22 DIAGNOSIS — I10 Essential (primary) hypertension: Secondary | ICD-10-CM | POA: Diagnosis not present

## 2019-08-22 DIAGNOSIS — K219 Gastro-esophageal reflux disease without esophagitis: Secondary | ICD-10-CM | POA: Diagnosis not present

## 2019-08-26 DIAGNOSIS — K219 Gastro-esophageal reflux disease without esophagitis: Secondary | ICD-10-CM | POA: Diagnosis not present

## 2019-08-26 DIAGNOSIS — N401 Enlarged prostate with lower urinary tract symptoms: Secondary | ICD-10-CM | POA: Diagnosis not present

## 2019-08-26 DIAGNOSIS — Z0000 Encounter for general adult medical examination without abnormal findings: Secondary | ICD-10-CM | POA: Diagnosis not present

## 2019-08-26 DIAGNOSIS — E785 Hyperlipidemia, unspecified: Secondary | ICD-10-CM | POA: Diagnosis not present

## 2019-08-26 DIAGNOSIS — E669 Obesity, unspecified: Secondary | ICD-10-CM | POA: Diagnosis not present

## 2019-08-26 DIAGNOSIS — Z1211 Encounter for screening for malignant neoplasm of colon: Secondary | ICD-10-CM | POA: Diagnosis not present

## 2019-08-26 DIAGNOSIS — I10 Essential (primary) hypertension: Secondary | ICD-10-CM | POA: Diagnosis not present

## 2019-09-15 DIAGNOSIS — Z03818 Encounter for observation for suspected exposure to other biological agents ruled out: Secondary | ICD-10-CM | POA: Diagnosis not present

## 2019-09-15 DIAGNOSIS — Z20828 Contact with and (suspected) exposure to other viral communicable diseases: Secondary | ICD-10-CM | POA: Diagnosis not present

## 2019-10-01 DIAGNOSIS — Z20828 Contact with and (suspected) exposure to other viral communicable diseases: Secondary | ICD-10-CM | POA: Diagnosis not present

## 2020-02-11 DIAGNOSIS — E785 Hyperlipidemia, unspecified: Secondary | ICD-10-CM | POA: Diagnosis not present

## 2020-02-25 DIAGNOSIS — I10 Essential (primary) hypertension: Secondary | ICD-10-CM | POA: Diagnosis not present

## 2020-02-25 DIAGNOSIS — E785 Hyperlipidemia, unspecified: Secondary | ICD-10-CM | POA: Diagnosis not present

## 2020-02-25 DIAGNOSIS — E669 Obesity, unspecified: Secondary | ICD-10-CM | POA: Diagnosis not present

## 2020-03-11 DIAGNOSIS — L578 Other skin changes due to chronic exposure to nonionizing radiation: Secondary | ICD-10-CM | POA: Diagnosis not present

## 2020-03-11 DIAGNOSIS — L570 Actinic keratosis: Secondary | ICD-10-CM | POA: Diagnosis not present

## 2020-03-11 DIAGNOSIS — Z85828 Personal history of other malignant neoplasm of skin: Secondary | ICD-10-CM | POA: Diagnosis not present

## 2020-03-11 DIAGNOSIS — L905 Scar conditions and fibrosis of skin: Secondary | ICD-10-CM | POA: Diagnosis not present

## 2020-03-11 DIAGNOSIS — D692 Other nonthrombocytopenic purpura: Secondary | ICD-10-CM | POA: Diagnosis not present

## 2020-03-30 DIAGNOSIS — N401 Enlarged prostate with lower urinary tract symptoms: Secondary | ICD-10-CM | POA: Diagnosis not present

## 2020-03-30 DIAGNOSIS — Z87448 Personal history of other diseases of urinary system: Secondary | ICD-10-CM | POA: Diagnosis not present

## 2020-09-10 DIAGNOSIS — I10 Essential (primary) hypertension: Secondary | ICD-10-CM | POA: Diagnosis not present

## 2020-09-10 DIAGNOSIS — Z125 Encounter for screening for malignant neoplasm of prostate: Secondary | ICD-10-CM | POA: Diagnosis not present

## 2020-09-10 DIAGNOSIS — E785 Hyperlipidemia, unspecified: Secondary | ICD-10-CM | POA: Diagnosis not present

## 2020-09-10 DIAGNOSIS — N401 Enlarged prostate with lower urinary tract symptoms: Secondary | ICD-10-CM | POA: Diagnosis not present

## 2020-09-14 DIAGNOSIS — E669 Obesity, unspecified: Secondary | ICD-10-CM | POA: Diagnosis not present

## 2020-09-14 DIAGNOSIS — Z0000 Encounter for general adult medical examination without abnormal findings: Secondary | ICD-10-CM | POA: Diagnosis not present

## 2020-09-14 DIAGNOSIS — E785 Hyperlipidemia, unspecified: Secondary | ICD-10-CM | POA: Diagnosis not present

## 2020-09-14 DIAGNOSIS — R011 Cardiac murmur, unspecified: Secondary | ICD-10-CM | POA: Diagnosis not present

## 2020-09-14 DIAGNOSIS — I10 Essential (primary) hypertension: Secondary | ICD-10-CM | POA: Diagnosis not present

## 2020-09-14 DIAGNOSIS — N401 Enlarged prostate with lower urinary tract symptoms: Secondary | ICD-10-CM | POA: Diagnosis not present

## 2020-09-14 DIAGNOSIS — K219 Gastro-esophageal reflux disease without esophagitis: Secondary | ICD-10-CM | POA: Diagnosis not present

## 2020-09-16 DIAGNOSIS — R001 Bradycardia, unspecified: Secondary | ICD-10-CM | POA: Diagnosis not present

## 2020-09-23 DIAGNOSIS — M19012 Primary osteoarthritis, left shoulder: Secondary | ICD-10-CM | POA: Diagnosis not present

## 2020-09-23 DIAGNOSIS — M7542 Impingement syndrome of left shoulder: Secondary | ICD-10-CM | POA: Diagnosis not present

## 2020-09-23 DIAGNOSIS — M25512 Pain in left shoulder: Secondary | ICD-10-CM | POA: Diagnosis not present

## 2020-09-26 DIAGNOSIS — M19012 Primary osteoarthritis, left shoulder: Secondary | ICD-10-CM | POA: Diagnosis not present

## 2020-09-26 DIAGNOSIS — M7542 Impingement syndrome of left shoulder: Secondary | ICD-10-CM | POA: Diagnosis not present

## 2021-03-16 DIAGNOSIS — I10 Essential (primary) hypertension: Secondary | ICD-10-CM | POA: Diagnosis not present

## 2021-03-16 DIAGNOSIS — E785 Hyperlipidemia, unspecified: Secondary | ICD-10-CM | POA: Diagnosis not present

## 2021-03-22 DIAGNOSIS — L578 Other skin changes due to chronic exposure to nonionizing radiation: Secondary | ICD-10-CM | POA: Diagnosis not present

## 2021-03-22 DIAGNOSIS — L570 Actinic keratosis: Secondary | ICD-10-CM | POA: Diagnosis not present

## 2021-03-22 DIAGNOSIS — L905 Scar conditions and fibrosis of skin: Secondary | ICD-10-CM | POA: Diagnosis not present

## 2021-03-22 DIAGNOSIS — Z85828 Personal history of other malignant neoplasm of skin: Secondary | ICD-10-CM | POA: Diagnosis not present

## 2021-04-19 DIAGNOSIS — M7542 Impingement syndrome of left shoulder: Secondary | ICD-10-CM | POA: Diagnosis not present

## 2021-04-28 DIAGNOSIS — M7582 Other shoulder lesions, left shoulder: Secondary | ICD-10-CM | POA: Diagnosis not present

## 2021-04-28 DIAGNOSIS — M19012 Primary osteoarthritis, left shoulder: Secondary | ICD-10-CM | POA: Diagnosis not present

## 2021-04-28 DIAGNOSIS — M25412 Effusion, left shoulder: Secondary | ICD-10-CM | POA: Diagnosis not present

## 2021-04-28 DIAGNOSIS — M7552 Bursitis of left shoulder: Secondary | ICD-10-CM | POA: Diagnosis not present

## 2021-05-05 DIAGNOSIS — M7542 Impingement syndrome of left shoulder: Secondary | ICD-10-CM | POA: Diagnosis not present

## 2021-05-05 DIAGNOSIS — M65812 Other synovitis and tenosynovitis, left shoulder: Secondary | ICD-10-CM | POA: Diagnosis not present

## 2021-06-03 DIAGNOSIS — J168 Pneumonia due to other specified infectious organisms: Secondary | ICD-10-CM | POA: Diagnosis not present

## 2021-06-03 DIAGNOSIS — R918 Other nonspecific abnormal finding of lung field: Secondary | ICD-10-CM | POA: Diagnosis not present

## 2021-06-03 DIAGNOSIS — R000 Tachycardia, unspecified: Secondary | ICD-10-CM | POA: Diagnosis not present

## 2021-06-03 DIAGNOSIS — Z20822 Contact with and (suspected) exposure to covid-19: Secondary | ICD-10-CM | POA: Diagnosis not present

## 2021-06-03 DIAGNOSIS — I10 Essential (primary) hypertension: Secondary | ICD-10-CM | POA: Diagnosis not present

## 2021-06-03 DIAGNOSIS — Z888 Allergy status to other drugs, medicaments and biological substances status: Secondary | ICD-10-CM | POA: Diagnosis not present

## 2021-06-03 DIAGNOSIS — R0789 Other chest pain: Secondary | ICD-10-CM | POA: Diagnosis not present

## 2021-06-03 DIAGNOSIS — Z7982 Long term (current) use of aspirin: Secondary | ICD-10-CM | POA: Diagnosis not present

## 2021-06-03 DIAGNOSIS — Z79899 Other long term (current) drug therapy: Secondary | ICD-10-CM | POA: Diagnosis not present

## 2021-06-03 DIAGNOSIS — R0902 Hypoxemia: Secondary | ICD-10-CM | POA: Diagnosis not present

## 2021-06-03 DIAGNOSIS — R062 Wheezing: Secondary | ICD-10-CM | POA: Diagnosis not present

## 2021-06-03 DIAGNOSIS — J189 Pneumonia, unspecified organism: Secondary | ICD-10-CM | POA: Diagnosis not present

## 2021-06-03 DIAGNOSIS — R059 Cough, unspecified: Secondary | ICD-10-CM | POA: Diagnosis not present

## 2021-06-03 DIAGNOSIS — R079 Chest pain, unspecified: Secondary | ICD-10-CM | POA: Diagnosis not present

## 2021-06-04 DIAGNOSIS — R059 Cough, unspecified: Secondary | ICD-10-CM | POA: Diagnosis not present

## 2021-06-04 DIAGNOSIS — R918 Other nonspecific abnormal finding of lung field: Secondary | ICD-10-CM | POA: Diagnosis not present

## 2021-07-15 DIAGNOSIS — S2249XA Multiple fractures of ribs, unspecified side, initial encounter for closed fracture: Secondary | ICD-10-CM | POA: Diagnosis not present

## 2021-07-15 DIAGNOSIS — S2232XA Fracture of one rib, left side, initial encounter for closed fracture: Secondary | ICD-10-CM | POA: Diagnosis not present

## 2021-07-15 DIAGNOSIS — M549 Dorsalgia, unspecified: Secondary | ICD-10-CM | POA: Diagnosis not present

## 2021-07-15 DIAGNOSIS — S0003XA Contusion of scalp, initial encounter: Secondary | ICD-10-CM | POA: Diagnosis not present

## 2021-07-15 DIAGNOSIS — R110 Nausea: Secondary | ICD-10-CM | POA: Diagnosis not present

## 2021-07-15 DIAGNOSIS — R0789 Other chest pain: Secondary | ICD-10-CM | POA: Diagnosis not present

## 2021-07-15 DIAGNOSIS — S2242XA Multiple fractures of ribs, left side, initial encounter for closed fracture: Secondary | ICD-10-CM | POA: Diagnosis not present

## 2021-07-15 DIAGNOSIS — R296 Repeated falls: Secondary | ICD-10-CM | POA: Diagnosis not present

## 2021-07-15 DIAGNOSIS — Z043 Encounter for examination and observation following other accident: Secondary | ICD-10-CM | POA: Diagnosis not present

## 2021-07-16 DIAGNOSIS — S2242XD Multiple fractures of ribs, left side, subsequent encounter for fracture with routine healing: Secondary | ICD-10-CM | POA: Diagnosis not present

## 2021-07-16 DIAGNOSIS — S2249XA Multiple fractures of ribs, unspecified side, initial encounter for closed fracture: Secondary | ICD-10-CM | POA: Diagnosis not present

## 2021-07-17 DIAGNOSIS — S2249XA Multiple fractures of ribs, unspecified side, initial encounter for closed fracture: Secondary | ICD-10-CM | POA: Diagnosis not present

## 2021-07-17 DIAGNOSIS — Z043 Encounter for examination and observation following other accident: Secondary | ICD-10-CM | POA: Diagnosis not present

## 2021-07-18 DIAGNOSIS — F1010 Alcohol abuse, uncomplicated: Secondary | ICD-10-CM | POA: Diagnosis not present

## 2021-07-18 DIAGNOSIS — I10 Essential (primary) hypertension: Secondary | ICD-10-CM | POA: Diagnosis not present

## 2021-07-18 DIAGNOSIS — N400 Enlarged prostate without lower urinary tract symptoms: Secondary | ICD-10-CM | POA: Diagnosis not present

## 2021-07-18 DIAGNOSIS — S2249XA Multiple fractures of ribs, unspecified side, initial encounter for closed fracture: Secondary | ICD-10-CM | POA: Diagnosis not present

## 2021-07-19 DIAGNOSIS — F1010 Alcohol abuse, uncomplicated: Secondary | ICD-10-CM | POA: Diagnosis not present

## 2021-07-19 DIAGNOSIS — S2249XA Multiple fractures of ribs, unspecified side, initial encounter for closed fracture: Secondary | ICD-10-CM | POA: Diagnosis not present

## 2021-07-20 DIAGNOSIS — I10 Essential (primary) hypertension: Secondary | ICD-10-CM | POA: Diagnosis not present

## 2021-07-20 DIAGNOSIS — F1010 Alcohol abuse, uncomplicated: Secondary | ICD-10-CM | POA: Diagnosis not present

## 2021-07-20 DIAGNOSIS — S2242XA Multiple fractures of ribs, left side, initial encounter for closed fracture: Secondary | ICD-10-CM | POA: Diagnosis not present

## 2021-07-20 DIAGNOSIS — K219 Gastro-esophageal reflux disease without esophagitis: Secondary | ICD-10-CM | POA: Diagnosis not present

## 2021-07-20 DIAGNOSIS — S2249XA Multiple fractures of ribs, unspecified side, initial encounter for closed fracture: Secondary | ICD-10-CM | POA: Diagnosis not present

## 2021-07-29 DIAGNOSIS — Z87891 Personal history of nicotine dependence: Secondary | ICD-10-CM | POA: Diagnosis not present

## 2021-07-29 DIAGNOSIS — F1010 Alcohol abuse, uncomplicated: Secondary | ICD-10-CM | POA: Diagnosis not present

## 2021-07-29 DIAGNOSIS — S2249XA Multiple fractures of ribs, unspecified side, initial encounter for closed fracture: Secondary | ICD-10-CM | POA: Diagnosis not present

## 2021-08-09 DIAGNOSIS — N400 Enlarged prostate without lower urinary tract symptoms: Secondary | ICD-10-CM | POA: Diagnosis not present

## 2023-07-26 ENCOUNTER — Encounter: Payer: Self-pay | Admitting: *Deleted
